# Patient Record
Sex: Female | Born: 1975 | Race: White | Hispanic: No | Marital: Married | Smoking: Current every day smoker
Health system: Southern US, Community
[De-identification: ages and names within clinical notes are randomized; demographics above are authoritative.]

## PROBLEM LIST (undated history)

## (undated) ENCOUNTER — Inpatient Hospital Stay (HOSPITAL_COMMUNITY): Payer: Self-pay

## (undated) DIAGNOSIS — E039 Hypothyroidism, unspecified: Secondary | ICD-10-CM

## (undated) DIAGNOSIS — L0291 Cutaneous abscess, unspecified: Secondary | ICD-10-CM

## (undated) DIAGNOSIS — E079 Disorder of thyroid, unspecified: Secondary | ICD-10-CM

## (undated) DIAGNOSIS — O24419 Gestational diabetes mellitus in pregnancy, unspecified control: Secondary | ICD-10-CM

## (undated) HISTORY — PX: ANKLE SURGERY: SHX546

## (undated) HISTORY — PX: CHOLECYSTECTOMY: SHX55

## (undated) HISTORY — DX: Gestational diabetes mellitus in pregnancy, unspecified control: O24.419

---

## 2003-12-05 ENCOUNTER — Other Ambulatory Visit: Admission: RE | Admit: 2003-12-05 | Discharge: 2003-12-05 | Payer: Self-pay

## 2004-12-31 ENCOUNTER — Other Ambulatory Visit: Admission: RE | Admit: 2004-12-31 | Discharge: 2004-12-31 | Payer: Self-pay

## 2009-10-23 ENCOUNTER — Other Ambulatory Visit: Admission: RE | Admit: 2009-10-23 | Discharge: 2009-10-23 | Payer: Self-pay | Admitting: Unknown Physician Specialty

## 2012-06-23 ENCOUNTER — Emergency Department (HOSPITAL_COMMUNITY)
Admission: EM | Admit: 2012-06-23 | Discharge: 2012-06-23 | Disposition: A | Discharge status: Home / Self Care | Payer: Self-pay | Attending: Emergency Medicine | Admitting: Emergency Medicine

## 2012-06-23 ENCOUNTER — Encounter (HOSPITAL_COMMUNITY): Payer: Self-pay | Admitting: *Deleted

## 2012-06-23 DIAGNOSIS — Z9089 Acquired absence of other organs: Secondary | ICD-10-CM | POA: Insufficient documentation

## 2012-06-23 DIAGNOSIS — L0390 Cellulitis, unspecified: Secondary | ICD-10-CM

## 2012-06-23 DIAGNOSIS — L0231 Cutaneous abscess of buttock: Secondary | ICD-10-CM | POA: Insufficient documentation

## 2012-06-23 DIAGNOSIS — L03317 Cellulitis of buttock: Secondary | ICD-10-CM | POA: Insufficient documentation

## 2012-06-23 LAB — BASIC METABOLIC PANEL
CO2: 25 mEq/L (ref 19–32)
Chloride: 102 mEq/L (ref 96–112)
Creatinine, Ser: 0.67 mg/dL (ref 0.50–1.10)
GFR calc Af Amer: >90 mL/min (ref >90)
Potassium: 3.4 mEq/L — ABNORMAL LOW (ref 3.5–5.1)
Sodium: 136 mEq/L (ref 135–145)

## 2012-06-23 LAB — CBC WITH DIFFERENTIAL/PLATELET
Basophils Absolute: 0 10*3/uL (ref 0.0–0.1)
Basophils Relative: 0 % (ref 0–1)
HCT: 38.2 % (ref 36.0–46.0)
Lymphocytes Relative: 16 % (ref 12–46)
MCHC: 34.8 g/dL (ref 30.0–36.0)
Neutro Abs: 5.8 10*3/uL (ref 1.7–7.7)
Neutrophils Relative %: 69 % (ref 43–77)
Platelets: 146 10*3/uL — ABNORMAL LOW (ref 150–400)
RDW: 12.3 % (ref 11.5–15.5)
WBC: 8.5 10*3/uL (ref 4.0–10.5)

## 2012-06-23 MED ORDER — SODIUM CHLORIDE 0.9 % IV SOLN
Freq: Once | INTRAVENOUS | Status: AC
  Administered 2012-06-23: 1000 mL via INTRAVENOUS

## 2012-06-23 MED ORDER — SULFAMETHOXAZOLE-TRIMETHOPRIM 800-160 MG PO TABS: 1.0000 | ORAL_TABLET | Freq: Two times a day (BID) | ORAL | Status: AC

## 2012-06-23 MED ORDER — VANCOMYCIN HCL IN DEXTROSE 1-5 GM/200ML-% IV SOLN
1000.0000 mg | Freq: Once | INTRAVENOUS | Status: AC
  Administered 2012-06-23: 1000 mg via INTRAVENOUS
  Filled 2012-06-23: qty 200

## 2012-06-23 MED ORDER — TRAMADOL HCL 50 MG PO TABS: 50.0000 mg | ORAL_TABLET | Freq: Four times a day (QID) | ORAL | Status: AC | PRN

## 2012-06-23 NOTE — ED Provider Notes (Signed)
History    This chart was scribed for Benny Lennert, MD, MD by Smitty Pluck. The patient was seen in room APA19 and the patient's care was started at 7:05AM.   CSN: [id number]  Arrival date & time 06/23/12  9528   First MD Initiated Contact with Patient 06/23/12 [phone]      Chief Complaint  Patient presents with  . Abscess    (Consider location/radiation/quality/duration/timing/severity/associated sxs/prior treatment) Patient is a 36 y.o. female presenting with abscess. The history is provided by the patient.  Abscess  This is a new problem. The current episode started less than one week ago. The onset was sudden. The problem occurs continuously. The problem has been gradually worsening. The abscess is present on the left buttock. The problem is moderate. The abscess is characterized by burning. It is unknown what she was exposed to. The abscess first occurred at work.   Vanessa Powell is a 36 y.o. female who presents to the Emergency Department complaining of moderate abscess onset 5 days ago. Pt reports that she felt burning on left buttocks then noticed the area. Symptoms have been constant since onset. Denies radiation. Denies other medical illnesses.   History reviewed. No pertinent past medical history.  Past Surgical History  Procedure Date  . Ankle surgery   . Cholecystectomy     No family history on file.  History  Substance Use Topics  . Smoking status: Current Everyday Smoker -- 0.5 packs/day    Types: Cigarettes  . Smokeless tobacco: Not on file  . Alcohol Use: No    OB History    Grav Para Term Preterm Abortions TAB SAB Ect Mult Living                  Review of Systems  All other systems reviewed and are negative.  10 Systems reviewed and all are negative for acute change except as noted in the HPI.    Allergies  Sulfa antibiotics  Home Medications  No current outpatient prescriptions on file.  BP 115/74  Pulse 101  Temp 98.1 F (36.7 C)  (Oral)  Resp 20  Ht 5\' 6"  (1.676 m)  Wt 182 lb (82.555 kg)  BMI 29.38 kg/m2  SpO2 97%  Physical Exam  Nursing note and vitals reviewed. Constitutional: She is oriented to person, place, and time. She appears well-developed.  HENT:  Head: Normocephalic.  Eyes: Conjunctivae are normal.  Neck: No tracheal deviation present.  Cardiovascular:  No murmur heard. Musculoskeletal: Normal range of motion.  Neurological: She is oriented to person, place, and time.  Skin: Skin is warm.       Left buttocks has erythema 10 cm in diameter  Middle of rash area has 4 cm indurated area No fluctuance    Psychiatric: She has a normal mood and affect.    ED Course  Procedures (including critical care time) DIAGNOSTIC STUDIES: Oxygen Saturation is 97% on room air, normal by my interpretation.    COORDINATION OF CARE: 7:08AM EDP discusses pt ED treatment with pt  7:15AM EDP ordered medication: 0.9% NaCl infusion, vancocin 1000 mg/ 200 ml    Labs Reviewed  CBC WITH DIFFERENTIAL - Abnormal; Notable for the following:    Platelets 146 (*)     Monocytes Relative 14 (*)     Monocytes Absolute 1.2 (*)     All other components within normal limits  BASIC METABOLIC PANEL - Abnormal; Notable for the following:    Potassium 3.4 (*)  Glucose, Bld 101 (*)     All other components within normal limits   No results found.   No diagnosis found.    MDM  cellulitis  Pt instructed to return here Friday or see dr. Leticia Penna.  She was told that she may need to have this infection drained friday     Benny Lennert, MD 06/23/12 [phone]

## 2012-06-23 NOTE — ED Notes (Signed)
Reports a knot on her left upper leg noticed as being sore Friday & has become worse.

## 2012-06-23 NOTE — ED Notes (Signed)
Abscess note to back left leg under buttocks. Redness & warmth noted.

## 2012-06-25 ENCOUNTER — Encounter (HOSPITAL_COMMUNITY): Payer: Self-pay

## 2012-06-25 ENCOUNTER — Emergency Department (HOSPITAL_COMMUNITY)
Admission: EM | Admit: 2012-06-25 | Discharge: 2012-06-25 | Disposition: A | Discharge status: Home / Self Care | Payer: Self-pay | Attending: Emergency Medicine | Admitting: Emergency Medicine

## 2012-06-25 DIAGNOSIS — L03119 Cellulitis of unspecified part of limb: Secondary | ICD-10-CM | POA: Insufficient documentation

## 2012-06-25 DIAGNOSIS — L02419 Cutaneous abscess of limb, unspecified: Secondary | ICD-10-CM | POA: Insufficient documentation

## 2012-06-25 DIAGNOSIS — L0291 Cutaneous abscess, unspecified: Secondary | ICD-10-CM

## 2012-06-25 DIAGNOSIS — F17200 Nicotine dependence, unspecified, uncomplicated: Secondary | ICD-10-CM | POA: Insufficient documentation

## 2012-06-25 HISTORY — DX: Cutaneous abscess, unspecified: L02.91

## 2012-06-25 LAB — CBC WITH DIFFERENTIAL/PLATELET
Basophils Relative: 0 % (ref 0–1)
HCT: 39.5 % (ref 36.0–46.0)
Hemoglobin: 13.6 g/dL (ref 12.0–15.0)
Lymphs Abs: 1.6 10*3/uL (ref 0.7–4.0)
MCH: 32.6 pg (ref 26.0–34.0)
MCHC: 34.4 g/dL (ref 30.0–36.0)
Monocytes Absolute: 1 10*3/uL (ref 0.1–1.0)
Monocytes Relative: 10 % (ref 3–12)
Neutro Abs: 6.7 10*3/uL (ref 1.7–7.7)
RBC: 4.17 MIL/uL (ref 3.87–5.11)

## 2012-06-25 LAB — BASIC METABOLIC PANEL
BUN: 11 mg/dL (ref 6–23)
Chloride: 106 mEq/L (ref 96–112)
Creatinine, Ser: 0.74 mg/dL (ref 0.50–1.10)
GFR calc Af Amer: >90 mL/min (ref >90)
Glucose, Bld: 99 mg/dL (ref 70–99)

## 2012-06-25 MED ORDER — HYDROMORPHONE HCL PF 1 MG/ML IJ SOLN
1.0000 mg | Freq: Once | INTRAMUSCULAR | Status: AC
  Administered 2012-06-25: 1 mg via INTRAVENOUS
  Filled 2012-06-25: qty 1

## 2012-06-25 MED ORDER — HYDROCODONE-ACETAMINOPHEN 5-325 MG PO TABS: 1.0000 | ORAL_TABLET | Freq: Four times a day (QID) | ORAL | Status: AC | PRN

## 2012-06-25 MED ORDER — PROMETHAZINE HCL 25 MG/ML IJ SOLN
12.5000 mg | Freq: Once | INTRAMUSCULAR | Status: AC
  Administered 2012-06-25: 12.5 mg via INTRAVENOUS
  Filled 2012-06-25: qty 1

## 2012-06-25 MED ORDER — VANCOMYCIN HCL IN DEXTROSE 1-5 GM/200ML-% IV SOLN
1000.0000 mg | Freq: Once | INTRAVENOUS | Status: AC
  Administered 2012-06-25: 1000 mg via INTRAVENOUS
  Filled 2012-06-25: qty 200

## 2012-06-25 MED ORDER — ONDANSETRON HCL 4 MG/2ML IJ SOLN
4.0000 mg | Freq: Once | INTRAMUSCULAR | Status: AC
  Administered 2012-06-25: 4 mg via INTRAVENOUS
  Filled 2012-06-25: qty 2

## 2012-06-25 MED ORDER — LIDOCAINE HCL (PF) 1 % IJ SOLN
INTRAMUSCULAR | Status: AC
  Administered 2012-06-25: 09:00:00 via SUBCUTANEOUS
  Filled 2012-06-25: qty 5

## 2012-06-25 MED ORDER — SODIUM CHLORIDE 0.9 % IV BOLUS (SEPSIS)
250.0000 mL | Freq: Once | INTRAVENOUS | Status: AC
  Administered 2012-06-25: 250 mL via INTRAVENOUS

## 2012-06-25 MED ORDER — SODIUM CHLORIDE 0.9 % IV SOLN: INTRAVENOUS | Status: DC

## 2012-06-25 NOTE — ED Notes (Signed)
Pt requested ginger ale which was provided.

## 2012-06-25 NOTE — ED Notes (Signed)
Abscess to back of left leg. States she was here Wednesday for same

## 2012-06-25 NOTE — ED Provider Notes (Signed)
History  This chart was scribed for Vanessa Jakes, MD by Gerlean Ren. This patient was seen in room APA03/APA03 and the patient's care was started at 7:07AM.  CSN: [id number]  Arrival date & time 06/25/12  5621   First MD Initiated Contact with Patient 06/25/12 [phone]      Chief Complaint  Patient presents with  . Abscess    Patient is a 36 y.o. female presenting with abscess. The history is provided by the patient. No language interpreter was used.  Abscess  This is a new problem. The current episode started more than one week ago. The onset was gradual. The problem occurs continuously. The problem has been gradually worsening. The abscess is present on the left upper leg. The abscess is characterized by burning. The abscess first occurred at work. Pertinent negatives include no fever, no diarrhea, no vomiting, no congestion and no sore throat. Recently, medical care has been given at this facility.    Vanessa Powell is a 36 y.o. female who presents to the Emergency Department complaining of a gradual onset, gradually worsening, constant abscess on the posterior left leg near buttock that presented as a burning pain 6 days prior. She lists nausea as an associated symptom and states that the abscess pain is worse with pressure. She reports that burning started when she had just come in from being outside.  Pt reports that the abscess began draining fluid 12 hours PTA.  She was seen 2 days ago in this ED with same complaint at which time she was prescribed SEPTRA.  However, pt has allergies to sulfa medications and was unable to afford doxycycline, the alternative.  She denies fever, sore throat, visual disturbance, CP, cough, emesis, diarrhea, urinary symptoms, back pain, HA and rash as associated symptoms.  She does not have a h/o chronic medical conditions. She is a current everyday smoker but denies alcohol use.  Past Medical History  Diagnosis Date  . Abscess     Past Surgical  History  Procedure Date  . Ankle surgery   . Cholecystectomy     No family history on file.  History  Substance Use Topics  . Smoking status: Current Everyday Smoker -- 0.5 packs/day    Types: Cigarettes  . Smokeless tobacco: Not on file  . Alcohol Use: No    No OB history provided.  Review of Systems  Constitutional: Negative for fever and chills.  HENT: Negative for congestion, sore throat and neck pain.   Eyes: Negative for visual disturbance.  Respiratory: Negative for shortness of breath.   Cardiovascular: Negative for chest pain.  Gastrointestinal: Positive for nausea. Negative for vomiting, abdominal pain and diarrhea.  Genitourinary: Negative for dysuria.  Musculoskeletal: Negative for back pain.  Skin: Positive for wound. Negative for rash.       abscess  Neurological: Negative for headaches.  Hematological: Does not bruise/bleed easily.  Psychiatric/Behavioral: Negative for confusion.    Allergies  Sulfa antibiotics  Home Medications   Current Outpatient Rx  Name Route Sig Dispense Refill  . HYDROCODONE-ACETAMINOPHEN 5-325 MG PO TABS Oral Take 1-2 tablets by mouth every 6 (six) hours as needed for pain. 10 tablet 0  . IBUPROFEN 200 MG PO TABS Oral Take 400 mg by mouth every 6 (six) hours as needed. For pain    . SULFAMETHOXAZOLE-TRIMETHOPRIM 800-160 MG PO TABS Oral Take 1 tablet by mouth every 12 (twelve) hours. 20 tablet 0  . TRAMADOL HCL 50 MG PO TABS Oral Take 1  tablet (50 mg total) by mouth every 6 (six) hours as needed for pain. 30 tablet 0    Triage Vitals: BP 113/76  Pulse 98  Temp 97.7 F (36.5 C) (Oral)  Resp 20  Ht 5\' 6"  (1.676 m)  Wt 182 lb (82.555 kg)  BMI 29.38 kg/m2  SpO2 98%  Physical Exam  Nursing note and vitals reviewed. Constitutional: She is oriented to person, place, and time. She appears well-developed and well-nourished. No distress.  HENT:  Head: Normocephalic and atraumatic.  Mouth/Throat: Oropharynx is clear and moist.    Eyes: Conjunctivae and EOM are normal.  Neck: Neck supple. No tracheal deviation present.  Cardiovascular: Normal rate, regular rhythm and normal heart sounds.   Pulmonary/Chest: Effort normal and breath sounds normal. No respiratory distress.  Abdominal: Soft. Bowel sounds are normal. There is no tenderness.  Musculoskeletal: Normal range of motion. She exhibits no edema.       No groin adenopathy  Lymphadenopathy:    She has no cervical adenopathy.  Neurological: She is alert and oriented to person, place, and time. No cranial nerve deficit.  Skin: Skin is warm and dry.     Psychiatric: She has a normal mood and affect. Her behavior is normal.    ED Course  Procedures (including critical care time)  DIAGNOSTIC STUDIES: Oxygen Saturation is 98% on room air, normal by my interpretation.    COORDINATION OF CARE: 7:30AM-Discussed treatment plan which included incision and drainage of the abscess with pt at bedside and pt agreed to plan.   Labs Reviewed  BASIC METABOLIC PANEL - Abnormal; Notable for the following:    Potassium 3.4 (*)     All other components within normal limits  CBC WITH DIFFERENTIAL   No results found. Results for orders placed during the hospital encounter of 06/25/12  CBC WITH DIFFERENTIAL      Component Value Range   WBC 9.5  4.0 - 10.5 K/uL   RBC 4.17  3.87 - 5.11 MIL/uL   Hemoglobin 13.6  12.0 - 15.0 g/dL   HCT 16.1  09.6 - 04.5 %   MCV 94.7  78.0 - 100.0 fL   MCH 32.6  26.0 - 34.0 pg   MCHC 34.4  30.0 - 36.0 g/dL   RDW 40.9  81.1 - 91.4 %   Platelets 185  150 - 400 K/uL   Neutrophils Relative 71  43 - 77 %   Neutro Abs 6.7  1.7 - 7.7 K/uL   Lymphocytes Relative 17  12 - 46 %   Lymphs Abs 1.6  0.7 - 4.0 K/uL   Monocytes Relative 10  3 - 12 %   Monocytes Absolute 1.0  0.1 - 1.0 K/uL   Eosinophils Relative 2  0 - 5 %   Eosinophils Absolute 0.2  0.0 - 0.7 K/uL   Basophils Relative 0  0 - 1 %   Basophils Absolute 0.0  0.0 - 0.1 K/uL  BASIC  METABOLIC PANEL      Component Value Range   Sodium 141  135 - 145 mEq/L   Potassium 3.4 (*) 3.5 - 5.1 mEq/L   Chloride 106  96 - 112 mEq/L   CO2 23  19 - 32 mEq/L   Glucose, Bld 99  70 - 99 mg/dL   BUN 11  6 - 23 mg/dL   Creatinine, Ser 7.82  0.50 - 1.10 mg/dL   Calcium 9.8  8.4 - 95.6 mg/dL   GFR calc non Af Amer >  90  >90 mL/min   GFR calc Af Amer >90  >90 mL/min     1. Abscess       MDM   Patient with left thigh abscess that was I&Dand packed patient followup in 2 days for recheck patient wasn't able to take antibiotics that she was given before you they were switched to doxycycline for them patient here today given another dose of vancomycin labs without leukocytosis which lites without significant abnormalities. Patient is nontoxic no acute distress.      I personally performed the services described in this documentation, which was scribed in my presence. The recorded information has been reviewed and considered.       INCISION AND DRAINAGE Performed by: Vanessa Powell. Consent: Verbal consent obtained. Risks and benefits: risks, benefits and alternatives were discussed Type: abscess  Body area: left posterior thigh  Anesthesia: local infiltration  Local anesthetic: lidocaine 2% without epinephrine  Anesthetic total: 7 ml  Complexity: complex Blunt dissection to break up loculations  Drainage: purulent  Drainage amount: copious  Packing material: 1/4 in iodoform gauze  Patient tolerance: Patient tolerated the procedure well with no immediate complications.     Vanessa Jakes, MD 06/25/12 [phone]

## 2012-06-27 DIAGNOSIS — L02419 Cutaneous abscess of limb, unspecified: Secondary | ICD-10-CM | POA: Insufficient documentation

## 2012-06-27 DIAGNOSIS — Z4800 Encounter for change or removal of nonsurgical wound dressing: Secondary | ICD-10-CM | POA: Insufficient documentation

## 2012-06-27 DIAGNOSIS — L03119 Cellulitis of unspecified part of limb: Secondary | ICD-10-CM | POA: Insufficient documentation

## 2012-06-28 ENCOUNTER — Emergency Department (HOSPITAL_COMMUNITY)
Admission: EM | Admit: 2012-06-28 | Discharge: 2012-06-28 | Disposition: A | Discharge status: Home / Self Care | Payer: Self-pay | Attending: Emergency Medicine | Admitting: Emergency Medicine

## 2012-06-28 NOTE — ED Notes (Signed)
Refer to downtime documentation forms 

## 2012-06-29 ENCOUNTER — Encounter (HOSPITAL_COMMUNITY): Payer: Self-pay

## 2012-06-29 ENCOUNTER — Emergency Department (HOSPITAL_COMMUNITY)
Admission: EM | Admit: 2012-06-29 | Discharge: 2012-06-29 | Disposition: A | Discharge status: Home / Self Care | Payer: Self-pay | Attending: Emergency Medicine | Admitting: Emergency Medicine

## 2012-06-29 DIAGNOSIS — F17200 Nicotine dependence, unspecified, uncomplicated: Secondary | ICD-10-CM | POA: Insufficient documentation

## 2012-06-29 DIAGNOSIS — Z4800 Encounter for change or removal of nonsurgical wound dressing: Secondary | ICD-10-CM | POA: Insufficient documentation

## 2012-06-29 DIAGNOSIS — Z5189 Encounter for other specified aftercare: Secondary | ICD-10-CM

## 2012-06-29 DIAGNOSIS — Z9089 Acquired absence of other organs: Secondary | ICD-10-CM | POA: Insufficient documentation

## 2012-06-29 NOTE — ED Notes (Signed)
Pt reports had packing put in abscess Sunday, here for packing removal

## 2012-06-29 NOTE — ED Provider Notes (Signed)
Medical screening examination/treatment/procedure(s) were performed by non-physician practitioner and as supervising physician I was immediately available for consultation/collaboration.   Shelda Jakes, MD 06/29/12 [phone]

## 2012-06-29 NOTE — ED Provider Notes (Signed)
History     CSN: [id number]  Arrival date & time 06/29/12  1015   First MD Initiated Contact with Patient 06/29/12 1039      Chief Complaint  Patient presents with  . Wound Check    (Consider location/radiation/quality/duration/timing/severity/associated sxs/prior treatment) HPI Comments: Abscess I&D 4 days ago and wound check with packing exchange 2 days ago.  Told to return again today for re-check.  Denies any pain.  Patient is a 36 y.o. female presenting with wound check. The history is provided by the patient. No language interpreter was used.  Wound Check  She was treated in the ED today. Previous treatment in the ED includes I&D of abscess and oral antibiotics. Treatments since wound repair include a wound recheck, regular soap and water washings and oral antibiotics. Her temperature was unmeasured prior to arrival. There has been colored discharge from the wound. The redness has improved. There is no swelling present. The pain has no pain.    Past Medical History  Diagnosis Date  . Abscess     Past Surgical History  Procedure Date  . Ankle surgery   . Cholecystectomy     No family history on file.  History  Substance Use Topics  . Smoking status: Current Everyday Smoker -- 0.5 packs/day    Types: Cigarettes  . Smokeless tobacco: Not on file  . Alcohol Use: No    OB History    Grav Para Term Preterm Abortions TAB SAB Ect Mult Living                  Review of Systems  Constitutional: Negative for fever and chills.  Skin: Positive for wound.  All other systems reviewed and are negative.    Allergies  Sulfa antibiotics  Home Medications   Current Outpatient Rx  Name Route Sig Dispense Refill  . HYDROCODONE-ACETAMINOPHEN 5-325 MG PO TABS Oral Take 1-2 tablets by mouth every 6 (six) hours as needed for pain. 10 tablet 0  . IBUPROFEN 200 MG PO TABS Oral Take 400 mg by mouth every 6 (six) hours as needed. For pain    . SULFAMETHOXAZOLE-TRIMETHOPRIM  800-160 MG PO TABS Oral Take 1 tablet by mouth every 12 (twelve) hours. 20 tablet 0  . TRAMADOL HCL 50 MG PO TABS Oral Take 1 tablet (50 mg total) by mouth every 6 (six) hours as needed for pain. 30 tablet 0    BP 123/81  Pulse 89  Temp 98.8 F (37.1 C) (Oral)  Resp 20  Ht 5\' 6"  (1.676 m)  Wt 182 lb (82.555 kg)  BMI 29.38 kg/m2  SpO2 100%  Physical Exam  Nursing note and vitals reviewed. Constitutional: She is oriented to person, place, and time. She appears well-developed and well-nourished. No distress.  HENT:  Head: Normocephalic and atraumatic.  Eyes: EOM are normal.  Neck: Normal range of motion.  Cardiovascular: Normal rate, regular rhythm and normal heart sounds.   Pulmonary/Chest: Effort normal and breath sounds normal.  Abdominal: Soft. She exhibits no distension. There is no tenderness.  Musculoskeletal: Normal range of motion.       Legs: Neurological: She is alert and oriented to person, place, and time.  Skin: Skin is warm and dry.  Psychiatric: She has a normal mood and affect. Judgment normal.    ED Course  Procedures (including critical care time)  Labs Reviewed - No data to display No results found.   1. Wound check, abscess       MDM  Continue abx til done.   Warm compresses Remove remaining packing in 2 days.   Return prn.        Evalina Field, [place] 06/29/12 1103

## 2013-05-23 ENCOUNTER — Encounter (HOSPITAL_COMMUNITY): Payer: Self-pay | Admitting: *Deleted

## 2013-05-23 ENCOUNTER — Emergency Department (HOSPITAL_COMMUNITY): Payer: Managed Care, Other (non HMO)

## 2013-05-23 ENCOUNTER — Emergency Department (HOSPITAL_COMMUNITY)
Admission: EM | Admit: 2013-05-23 | Discharge: 2013-05-23 | Disposition: A | Discharge status: Home / Self Care | Payer: Managed Care, Other (non HMO) | Attending: Emergency Medicine | Admitting: Emergency Medicine

## 2013-05-23 DIAGNOSIS — R109 Unspecified abdominal pain: Secondary | ICD-10-CM

## 2013-05-23 DIAGNOSIS — F17200 Nicotine dependence, unspecified, uncomplicated: Secondary | ICD-10-CM | POA: Insufficient documentation

## 2013-05-23 DIAGNOSIS — B9689 Other specified bacterial agents as the cause of diseases classified elsewhere: Secondary | ICD-10-CM

## 2013-05-23 DIAGNOSIS — M549 Dorsalgia, unspecified: Secondary | ICD-10-CM | POA: Insufficient documentation

## 2013-05-23 DIAGNOSIS — Z872 Personal history of diseases of the skin and subcutaneous tissue: Secondary | ICD-10-CM | POA: Insufficient documentation

## 2013-05-23 DIAGNOSIS — N898 Other specified noninflammatory disorders of vagina: Secondary | ICD-10-CM | POA: Insufficient documentation

## 2013-05-23 DIAGNOSIS — N760 Acute vaginitis: Secondary | ICD-10-CM | POA: Insufficient documentation

## 2013-05-23 LAB — URINALYSIS, ROUTINE W REFLEX MICROSCOPIC
Bilirubin Urine: NEGATIVE
Ketones, ur: NEGATIVE mg/dL
Nitrite: NEGATIVE
Urobilinogen, UA: 0.2 mg/dL (ref 0.0–1.0)

## 2013-05-23 LAB — CBC WITH DIFFERENTIAL/PLATELET
Basophils Absolute: 0 10*3/uL (ref 0.0–0.1)
Basophils Relative: 0 % (ref 0–1)
Eosinophils Absolute: 0.1 10*3/uL (ref 0.0–0.7)
Eosinophils Relative: 2 % (ref 0–5)
HCT: 42.3 % (ref 36.0–46.0)
Hemoglobin: 15 g/dL (ref 12.0–15.0)
MCH: 33.6 pg (ref 26.0–34.0)
MCHC: 35.5 g/dL (ref 30.0–36.0)
MCV: 94.6 fL (ref 78.0–100.0)
Monocytes Absolute: 0.6 10*3/uL (ref 0.1–1.0)
Monocytes Relative: 11 % (ref 3–12)
Neutro Abs: 3.6 10*3/uL (ref 1.7–7.7)
RDW: 12.3 % (ref 11.5–15.5)

## 2013-05-23 LAB — WET PREP, GENITAL
Trich, Wet Prep: NONE SEEN
Yeast Wet Prep HPF POC: NONE SEEN

## 2013-05-23 LAB — BASIC METABOLIC PANEL
BUN: 10 mg/dL (ref 6–23)
Calcium: 9.2 mg/dL (ref 8.4–10.5)
Creatinine, Ser: 0.81 mg/dL (ref 0.50–1.10)
GFR calc Af Amer: >90 mL/min (ref >90)

## 2013-05-23 LAB — URINE MICROSCOPIC-ADD ON

## 2013-05-23 LAB — HCG, QUANTITATIVE, PREGNANCY: hCG, Beta Chain, Quant, S: <1 m[IU]/mL (ref <5)

## 2013-05-23 MED ORDER — METRONIDAZOLE 500 MG PO TABS: 500.0000 mg | ORAL_TABLET | Freq: Two times a day (BID) | ORAL | Status: DC

## 2013-05-23 MED ORDER — OXYCODONE-ACETAMINOPHEN 5-325 MG PO TABS: 1.0000 | ORAL_TABLET | Freq: Four times a day (QID) | ORAL | Status: DC | PRN

## 2013-05-23 MED ORDER — PROMETHAZINE HCL 25 MG PO TABS: 25.0000 mg | ORAL_TABLET | Freq: Four times a day (QID) | ORAL | Status: DC | PRN

## 2013-05-23 NOTE — ED Notes (Signed)
Coke and crackers given to pt.

## 2013-05-23 NOTE — ED Provider Notes (Signed)
History     This chart was scribed for Vanessa Hutching, MD, MD by Smitty Pluck, ED Scribe. The patient was seen in room APA19/APA19 and the patient's care was started at 7:47 AM.   CSN: [id number]  Arrival date & time 05/23/13  0700      Chief Complaint  Patient presents with  . Abdominal Pain    The history is provided by the patient and medical records. No language interpreter was used.   HPI Comments: Vanessa Powell is a 37 y.o. female who presents to the Emergency Department complaining of constant, moderate vaginal discharge and vaginal bleeding onset 2 days ago. She reports that she bleed less than a cup. Pt states that she normally has irregular periods. She mentions the vaginal bleeding is consistent with amount of her normal periods now. She mentions having associated lower abdominal cramping and back pain onset 2 days ago. Pt denies fever, chills, nausea, vomiting, diarrhea, weakness, cough, SOB and any other pain. She states she had DEPO injection was July 2013. Pt reports that she is G2 P2 A0. She reports having a cyst on her breast 2 weeks ago.   PCP is Dr. Neita Carp    Past Medical History  Diagnosis Date  . Abscess     Past Surgical History  Procedure Laterality Date  . Ankle surgery    . Cholecystectomy      No family history on file.  History  Substance Use Topics  . Smoking status: Current Every Day Smoker -- 0.50 packs/day    Types: Cigarettes  . Smokeless tobacco: Not on file  . Alcohol Use: No    OB History   Grav Para Term Preterm Abortions TAB SAB Ect Mult Living                  Review of Systems 10 Systems reviewed and all are negative for acute change except as noted in the HPI.   Allergies  Sulfa antibiotics  Home Medications   Current Outpatient Rx  Name  Route  Sig  Dispense  Refill  . ibuprofen (ADVIL,MOTRIN) 200 MG tablet   Oral   Take 400 mg by mouth every 6 (six) hours as needed. For pain           BP 133/84  Pulse 79   Temp(Src) 98.3 F (36.8 C) (Oral)  Resp 16  Ht 5\' 6"  (1.676 m)  Wt 185 lb (83.915 kg)  BMI 29.87 kg/m2  SpO2 98%  LMP 05/21/2013  Physical Exam  Nursing note and vitals reviewed. Constitutional: She is oriented to person, place, and time. She appears well-developed and well-nourished.  HENT:  Head: Normocephalic and atraumatic.  Eyes: Conjunctivae and EOM are normal. Pupils are equal, round, and reactive to light.  Neck: Normal range of motion. Neck supple.  Cardiovascular: Normal rate, regular rhythm and normal heart sounds.   Pulmonary/Chest: Effort normal and breath sounds normal.  Abdominal: Soft. Bowel sounds are normal. There is tenderness (mild lower abdominal ).  Musculoskeletal: Normal range of motion.  Neurological: She is alert and oriented to person, place, and time.  Skin: Skin is warm and dry.  Psychiatric: She has a normal mood and affect.    ED Course  Procedures (including critical care time) DIAGNOSTIC STUDIES: Oxygen Saturation is 98% on room air, normal by my interpretation.    COORDINATION OF CARE: 7:50 AM Discussed ED treatment with pt and pt agrees to pregnancy test, pelvic exam and UA.  Pt informed  that she might be having miscarriage.    Results for orders placed during the hospital encounter of 05/23/13  CBC WITH DIFFERENTIAL      Result Value Range   WBC 5.4  4.0 - 10.5 K/uL   RBC 4.47  3.87 - 5.11 MIL/uL   Hemoglobin 15.0  12.0 - 15.0 g/dL   HCT 16.1  09.6 - 04.5 %   MCV 94.6  78.0 - 100.0 fL   MCH 33.6  26.0 - 34.0 pg   MCHC 35.5  30.0 - 36.0 g/dL   RDW 40.9  81.1 - 91.4 %   Platelets 128 (*) 150 - 400 K/uL   Neutrophils Relative % 68  43 - 77 %   Neutro Abs 3.6  1.7 - 7.7 K/uL   Lymphocytes Relative 19  12 - 46 %   Lymphs Abs 1.0  0.7 - 4.0 K/uL   Monocytes Relative 11  3 - 12 %   Monocytes Absolute 0.6  0.1 - 1.0 K/uL   Eosinophils Relative 2  0 - 5 %   Eosinophils Absolute 0.1  0.0 - 0.7 K/uL   Basophils Relative 0  0 - 1 %    Basophils Absolute 0.0  0.0 - 0.1 K/uL  BASIC METABOLIC PANEL      Result Value Range   Sodium 136  135 - 145 mEq/L   Potassium 4.0  3.5 - 5.1 mEq/L   Chloride 102  96 - 112 mEq/L   CO2 27  19 - 32 mEq/L   Glucose, Bld 101 (*) 70 - 99 mg/dL   BUN 10  6 - 23 mg/dL   Creatinine, Ser 7.82  0.50 - 1.10 mg/dL   Calcium 9.2  8.4 - 95.6 mg/dL   GFR calc non Af Amer >90  >90 mL/min   GFR calc Af Amer >90  >90 mL/min  HCG, QUANTITATIVE, PREGNANCY      Result Value Range   hCG, Beta Chain, Quant, S <1  <5 mIU/mL  URINALYSIS, ROUTINE W REFLEX MICROSCOPIC      Result Value Range   Color, Urine YELLOW  YELLOW   APPearance CLEAR  CLEAR   Specific Gravity, Urine 1.010  1.005 - 1.030   pH 6.0  5.0 - 8.0   Glucose, UA NEGATIVE  NEGATIVE mg/dL   Hgb urine dipstick MODERATE (*) NEGATIVE   Bilirubin Urine NEGATIVE  NEGATIVE   Ketones, ur NEGATIVE  NEGATIVE mg/dL   Protein, ur NEGATIVE  NEGATIVE mg/dL   Urobilinogen, UA 0.2  0.0 - 1.0 mg/dL   Nitrite NEGATIVE  NEGATIVE   Leukocytes, UA NEGATIVE  NEGATIVE  URINE MICROSCOPIC-ADD ON      Result Value Range   Squamous Epithelial / LPF FEW (*) RARE   WBC, UA 0-2  <3 WBC/hpf   RBC / HPF 0-2  <3 RBC/hpf       US Transvaginal Non-ob  05/23/2013   *RADIOLOGY REPORT*  Clinical Data: Abdominal pelvic pain and cramping.  TRANSABDOMINAL AND TRANSVAGINAL ULTRASOUND OF PELVIS Technique:  Both transabdominal and transvaginal ultrasound examinations of the pelvis were performed. Transabdominal technique was performed for global imaging of the pelvis including uterus, ovaries, adnexal regions, and pelvic cul-de-sac.  It was necessary to proceed with endovaginal exam following the transabdominal exam to visualize the endometrium and ovaries to better advantage.  Comparison:  None  Findings:  Uterus: The myometrium appears homogeneous.  The uterus measures 7.4 x 3.5 x 4.8 cm.  Endometrium: 8.1 mm in thickness.  There is trace fluid within the endometrial canal.   Right ovary:  Normal in size and appearance with a small follicle. Measures 4.2 x 1.4 x 1.9 cm.  Left ovary: Normal in size and appearance with a small follicle. Measures 2.8 x 1.4 x 1.9 cm.  Other findings: No free fluid  IMPRESSION: Normal study. No evidence of pelvic mass or other significant abnormality.   Original Report Authenticated By: Carey Bullocks, M.D.   US Pelvis Complete  05/23/2013   *RADIOLOGY REPORT*  Clinical Data: Abdominal pelvic pain and cramping.  TRANSABDOMINAL AND TRANSVAGINAL ULTRASOUND OF PELVIS Technique:  Both transabdominal and transvaginal ultrasound examinations of the pelvis were performed. Transabdominal technique was performed for global imaging of the pelvis including uterus, ovaries, adnexal regions, and pelvic cul-de-sac.  It was necessary to proceed with endovaginal exam following the transabdominal exam to visualize the endometrium and ovaries to better advantage.  Comparison:  None  Findings:  Uterus: The myometrium appears homogeneous.  The uterus measures 7.4 x 3.5 x 4.8 cm.  Endometrium: 8.1 mm in thickness.  There is trace fluid within the endometrial canal.  Right ovary:  Normal in size and appearance with a small follicle. Measures 4.2 x 1.4 x 1.9 cm.  Left ovary: Normal in size and appearance with a small follicle. Measures 2.8 x 1.4 x 1.9 cm.  Other findings: No free fluid  IMPRESSION: Normal study. No evidence of pelvic mass or other significant abnormality.   Original Report Authenticated By: Carey Bullocks, M.D.     No diagnosis found.    MDM  No acute abdomen.  Ultrasound shows no acute findings.  Wet prep reveals evidence of bacterial vaginosis. Discharge meds Flagyl 500 mg twice a day #14, Percocet #20, Phenergan 25 mg #15.  Referral to OB/GYN      I personally performed the services described in this documentation, which was scribed in my presence. The recorded information has been reviewed and is accurate.    Vanessa Hutching, MD 05/23/13  [phone]

## 2013-05-23 NOTE — ED Notes (Signed)
On Saturday, noticed mucus and bleeding from vagina. Pt states irregular periods. Abdominal cramping and back pain since.

## 2013-05-24 LAB — GC/CHLAMYDIA PROBE AMP: GC Probe RNA: NEGATIVE

## 2013-07-28 LAB — OB RESULTS CONSOLE ABO/RH: RH Type: POSITIVE

## 2013-07-28 LAB — OB RESULTS CONSOLE TSH: TSH: 38.05

## 2013-07-28 LAB — OB RESULTS CONSOLE GC/CHLAMYDIA: Gonorrhea: NEGATIVE

## 2013-08-11 ENCOUNTER — Other Ambulatory Visit (HOSPITAL_COMMUNITY): Payer: Self-pay | Admitting: Emergency Medicine

## 2013-08-11 ENCOUNTER — Emergency Department (HOSPITAL_COMMUNITY): Payer: Managed Care, Other (non HMO)

## 2013-08-11 ENCOUNTER — Encounter (HOSPITAL_COMMUNITY): Payer: Self-pay | Admitting: Emergency Medicine

## 2013-08-11 ENCOUNTER — Emergency Department (HOSPITAL_COMMUNITY)
Admission: EM | Admit: 2013-08-11 | Discharge: 2013-08-11 | Disposition: A | Discharge status: Home / Self Care | Payer: Managed Care, Other (non HMO) | Attending: Emergency Medicine | Admitting: Emergency Medicine

## 2013-08-11 DIAGNOSIS — B9689 Other specified bacterial agents as the cause of diseases classified elsewhere: Secondary | ICD-10-CM

## 2013-08-11 DIAGNOSIS — Z8639 Personal history of other endocrine, nutritional and metabolic disease: Secondary | ICD-10-CM | POA: Insufficient documentation

## 2013-08-11 DIAGNOSIS — O2390 Unspecified genitourinary tract infection in pregnancy, unspecified trimester: Secondary | ICD-10-CM | POA: Insufficient documentation

## 2013-08-11 DIAGNOSIS — Z872 Personal history of diseases of the skin and subcutaneous tissue: Secondary | ICD-10-CM | POA: Insufficient documentation

## 2013-08-11 DIAGNOSIS — O30041 Twin pregnancy, dichorionic/diamniotic, first trimester: Secondary | ICD-10-CM

## 2013-08-11 DIAGNOSIS — N939 Abnormal uterine and vaginal bleeding, unspecified: Secondary | ICD-10-CM

## 2013-08-11 DIAGNOSIS — O30009 Twin pregnancy, unspecified number of placenta and unspecified number of amniotic sacs, unspecified trimester: Secondary | ICD-10-CM | POA: Insufficient documentation

## 2013-08-11 DIAGNOSIS — O99330 Smoking (tobacco) complicating pregnancy, unspecified trimester: Secondary | ICD-10-CM | POA: Insufficient documentation

## 2013-08-11 DIAGNOSIS — O200 Threatened abortion: Secondary | ICD-10-CM

## 2013-08-11 DIAGNOSIS — O30049 Twin pregnancy, dichorionic/diamniotic, unspecified trimester: Secondary | ICD-10-CM | POA: Insufficient documentation

## 2013-08-11 DIAGNOSIS — N760 Acute vaginitis: Secondary | ICD-10-CM | POA: Insufficient documentation

## 2013-08-11 DIAGNOSIS — O209 Hemorrhage in early pregnancy, unspecified: Secondary | ICD-10-CM | POA: Insufficient documentation

## 2013-08-11 DIAGNOSIS — Z862 Personal history of diseases of the blood and blood-forming organs and certain disorders involving the immune mechanism: Secondary | ICD-10-CM | POA: Insufficient documentation

## 2013-08-11 HISTORY — DX: Disorder of thyroid, unspecified: E07.9

## 2013-08-11 LAB — WET PREP, GENITAL
Trich, Wet Prep: NONE SEEN
WBC, Wet Prep HPF POC: NONE SEEN
Yeast Wet Prep HPF POC: NONE SEEN

## 2013-08-11 LAB — CBC
HCT: 39.7 % (ref 36.0–46.0)
Hemoglobin: 13.7 g/dL (ref 12.0–15.0)
MCH: 33.1 pg (ref 26.0–34.0)
MCHC: 34.5 g/dL (ref 30.0–36.0)

## 2013-08-11 LAB — URINALYSIS W MICROSCOPIC + REFLEX CULTURE
Bilirubin Urine: NEGATIVE
Glucose, UA: NEGATIVE mg/dL
Ketones, ur: NEGATIVE mg/dL
Leukocytes, UA: NEGATIVE
Protein, ur: NEGATIVE mg/dL
pH: 7 (ref 5.0–8.0)

## 2013-08-11 MED ORDER — METRONIDAZOLE 500 MG PO TABS: 500.0000 mg | ORAL_TABLET | Freq: Two times a day (BID) | ORAL | Status: DC

## 2013-08-11 NOTE — ED Notes (Signed)
Patient reports is [redacted] weeks pregnant and this morning when she wiped after urinating, there was a scant amount of blood on toilet paper. Denies pain or cramping.

## 2013-08-11 NOTE — ED Provider Notes (Signed)
CSN: [id number]     Arrival date & time 08/11/13  0734 History   First MD Initiated Contact with Patient 08/11/13 [id number]     Chief Complaint  Patient presents with  . Vaginal Bleeding    HPI Pt was seen at 0740. Per pt, c/o gradual onset and persistence of constant vaginal bleeding since this morning PTA. Pt states she saw blood on the toilet paper after urinating x2 PTA. Pt hx G3P2, LMP 06/12/13 with EGA [redacted] weeks 4/7 days. Denies pelvic pain, no cramping, no back/flank pain, no dysuria/hematuria, no vaginal discharge, no CP/SOB.    OB/GYN: [facility] in [place] Past Medical History  Diagnosis Date  . Abscess   . Thyroid disease    Past Surgical History  Procedure Laterality Date  . Ankle surgery    . Cholecystectomy     OB History   Grav Para Term Preterm Abortions TAB SAB Ect Mult Living   3 2              History  Substance Use Topics  . Smoking status: Current Every Day Smoker -- 0.50 packs/day    Types: Cigarettes  . Smokeless tobacco: Not on file  . Alcohol Use: No    Review of Systems ROS: Statement: All systems negative except as marked or noted in the HPI; Constitutional: Negative for fever and chills. ; ; Eyes: Negative for eye pain, redness and discharge. ; ; ENMT: Negative for ear pain, hoarseness, nasal congestion, sinus pressure and sore throat. ; ; Cardiovascular: Negative for chest pain, palpitations, diaphoresis, dyspnea and peripheral edema. ; ; Respiratory: Negative for cough, wheezing and stridor. ; ; Gastrointestinal: Negative for nausea, vomiting, diarrhea, abdominal pain, blood in stool, hematemesis, jaundice and rectal bleeding. . ; ; Genitourinary: Negative for dysuria, flank pain and hematuria. ; ; GYN:  +vaginal bleeding, no vaginal discharge, no vulvar pain.;;  Musculoskeletal: Negative for back pain and neck pain. Negative for swelling and trauma.; ; Skin: Negative for pruritus, rash, abrasions, blisters, bruising and skin lesion.; ; Neuro: Negative  for headache, lightheadedness and neck stiffness. Negative for weakness, altered level of consciousness , altered mental status, extremity weakness, paresthesias, involuntary movement, seizure and syncope.       Allergies  Sulfa antibiotics  Home Medications   Current Outpatient Rx  Name  Route  Sig  Dispense  Refill  . ibuprofen (ADVIL,MOTRIN) 200 MG tablet   Oral   Take 400 mg by mouth every 6 (six) hours as needed for pain.          . metroNIDAZOLE (FLAGYL) 500 MG tablet   Oral   Take 1 tablet (500 mg total) by mouth 2 (two) times daily. One po bid x 7 days   14 tablet   0   . oxyCODONE-acetaminophen (PERCOCET/ROXICET) 5-325 MG per tablet   Oral   Take 1-2 tablets by mouth every 6 (six) hours as needed for pain.   20 tablet   0   . promethazine (PHENERGAN) 25 MG tablet   Oral   Take 1 tablet (25 mg total) by mouth every 6 (six) hours as needed.   15 tablet   0    LMP 06/12/2013 Physical Exam 0745: Physical examination:  Nursing notes reviewed; Vital signs and O2 SAT reviewed;  Constitutional: Well developed, Well nourished, Well hydrated, In no acute distress; Head:  Normocephalic, atraumatic; Eyes: EOMI, PERRL, No scleral icterus; ENMT: Mouth and pharynx normal, Mucous membranes moist; Neck: Supple, Full range of  motion, No lymphadenopathy; Cardiovascular: Regular rate and rhythm, No murmur, rub, or gallop; Respiratory: Breath sounds clear & equal bilaterally, No rales, rhonchi, wheezes.  Speaking full sentences with ease, Normal respiratory effort/excursion; Chest: Nontender, Movement normal; Abdomen: Soft, Nontender, Nondistended, Normal bowel sounds; Genitourinary: No CVA tenderness. Pelvic exam performed with permission of pt and female ED tech assist during exam.  External genitalia w/o lesions. Vaginal vault without discharge, scant amount of blood in vault. Cervix w/o lesions, not friable, closed, no active bleeding from os. GC/chlam and wet prep obtained and sent  to lab.  Bimanual exam w/o CMT, uterine or adnexal tenderness.;;; Extremities: Pulses normal, No tenderness, No edema, No calf edema or asymmetry.; Neuro: AA&Ox3, Major CN grossly intact.  Speech clear. Gait steady. No gross focal motor or sensory deficits in extremities.; Skin: Color normal, Warm, Dry.   ED Course  Procedures    MDM  MDM Reviewed: previous chart, nursing note and vitals Reviewed previous: labs Interpretation: labs and ultrasound     Results for orders placed during the hospital encounter of 08/11/13  WET PREP, GENITAL      Result Value Range   Yeast Wet Prep HPF POC NONE SEEN  NONE SEEN   Trich, Wet Prep NONE SEEN  NONE SEEN   Clue Cells Wet Prep HPF POC FEW (*) NONE SEEN   WBC, Wet Prep HPF POC NONE SEEN  NONE SEEN  URINALYSIS W MICROSCOPIC + REFLEX CULTURE      Result Value Range   Color, Urine YELLOW  YELLOW   APPearance CLEAR  CLEAR   Specific Gravity, Urine 1.015  1.005 - 1.030   pH 7.0  5.0 - 8.0   Glucose, UA NEGATIVE  NEGATIVE mg/dL   Hgb urine dipstick LARGE (*) NEGATIVE   Bilirubin Urine NEGATIVE  NEGATIVE   Ketones, ur NEGATIVE  NEGATIVE mg/dL   Protein, ur NEGATIVE  NEGATIVE mg/dL   Urobilinogen, UA 1.0  0.0 - 1.0 mg/dL   Nitrite NEGATIVE  NEGATIVE   Leukocytes, UA NEGATIVE  NEGATIVE   RBC / HPF TOO NUMEROUS TO COUNT  <3 RBC/hpf  CBC      Result Value Range   WBC 9.2  4.0 - 10.5 K/uL   RBC 4.14  3.87 - 5.11 MIL/uL   Hemoglobin 13.7  12.0 - 15.0 g/dL   HCT 30.8  65.7 - 84.6 %   MCV 95.9  78.0 - 100.0 fL   MCH 33.1  26.0 - 34.0 pg   MCHC 34.5  30.0 - 36.0 g/dL   RDW 96.2  95.2 - 84.1 %   Platelets 149 (*) 150 - 400 K/uL  HCG, QUANTITATIVE, PREGNANCY      Result Value Range   hCG, Beta Chain, Quant, S 32440 (*) <5 mIU/mL  ABO/RH      Result Value Range   ABO/RH(D) AB POS     US Ob Comp Less 14 Wks 08/11/2013   CLINICAL DATA:  Vaginal bleeding. Positive pregnancy test. Eight week 4 day gestational age by LMP.  EXAM: US OB LESS THAN 14  WEEK TWIN GESTATION AND TRANSVAGINAL OB ULTRASOUND  COMPARISON:  None.  FINDINGS: TWIN 1  Intrauterine gestational sac: Visualized/normal in shape.  Yolk sac:  VISUALIZED  Embryo:  VISUALIZED  Cardiac Activity: VISUALIZED  Heart Rate: 175 bpm  CRL:  21  mm   8 w 5 d                  [place] EDC: 03/18/2014  TWIN 2  Intrauterine gestational sac: Irregular-shaped and located in lower uterine segment  Yolk sac:  Visualized  Embryo:  Not visualized  MSD: 35  mm   8 w   5  d  Maternal uterus/adnexae: Right ovarian corpus luteum noted. No adnexal mass or free fluid identified.  IMPRESSION:  Dichorionic twin IUP, with 1 living IUP measuring 8 weeks 5 days with U/S EDC of 03/18/2014. The other IUP is likely a failed IUP/ vanishing twin. Continued followup by ultrasound recommended in 10 days.   Electronically Signed   By: Myles Rosenthal   On: 08/11/2013 09:36    2130:  Will tx for BV. Scant blood in vaginal vault with closed cervical os. Will need OB/GYN f/u in 2 days. Pt wants to go home now. Dx and testing d/w pt and family.  Questions answered.  Verb understanding, agreeable to d/c home with outpt f/u.   Laray Anger, DO 08/14/13 1150

## 2013-10-03 LAB — OB RESULTS CONSOLE TSH: TSH: 8.18

## 2013-10-20 ENCOUNTER — Other Ambulatory Visit: Payer: Self-pay | Admitting: Obstetrics & Gynecology

## 2013-10-20 DIAGNOSIS — O3680X0 Pregnancy with inconclusive fetal viability, not applicable or unspecified: Secondary | ICD-10-CM

## 2013-10-21 ENCOUNTER — Encounter: Payer: Self-pay | Admitting: *Deleted

## 2013-10-21 DIAGNOSIS — O09522 Supervision of elderly multigravida, second trimester: Secondary | ICD-10-CM

## 2013-10-21 DIAGNOSIS — E039 Hypothyroidism, unspecified: Secondary | ICD-10-CM

## 2013-10-21 NOTE — Progress Notes (Signed)
Pt had normal pap on 07/28/13

## 2013-10-24 ENCOUNTER — Ambulatory Visit (INDEPENDENT_AMBULATORY_CARE_PROVIDER_SITE_OTHER): Payer: Medicaid Other | Admitting: Women's Health

## 2013-10-24 ENCOUNTER — Ambulatory Visit (INDEPENDENT_AMBULATORY_CARE_PROVIDER_SITE_OTHER): Payer: Managed Care, Other (non HMO)

## 2013-10-24 ENCOUNTER — Other Ambulatory Visit: Payer: Self-pay | Admitting: Obstetrics & Gynecology

## 2013-10-24 ENCOUNTER — Telehealth: Payer: Self-pay | Admitting: Women's Health

## 2013-10-24 ENCOUNTER — Encounter: Payer: Self-pay | Admitting: Women's Health

## 2013-10-24 ENCOUNTER — Encounter (INDEPENDENT_AMBULATORY_CARE_PROVIDER_SITE_OTHER): Payer: Self-pay

## 2013-10-24 VITALS — BP 124/80 | Wt 186.5 lb

## 2013-10-24 DIAGNOSIS — IMO0002 Reserved for concepts with insufficient information to code with codable children: Secondary | ICD-10-CM

## 2013-10-24 DIAGNOSIS — E039 Hypothyroidism, unspecified: Secondary | ICD-10-CM

## 2013-10-24 DIAGNOSIS — Z331 Pregnant state, incidental: Secondary | ICD-10-CM

## 2013-10-24 DIAGNOSIS — O0932 Supervision of pregnancy with insufficient antenatal care, second trimester: Secondary | ICD-10-CM

## 2013-10-24 DIAGNOSIS — O09529 Supervision of elderly multigravida, unspecified trimester: Secondary | ICD-10-CM

## 2013-10-24 DIAGNOSIS — O09522 Supervision of elderly multigravida, second trimester: Secondary | ICD-10-CM

## 2013-10-24 DIAGNOSIS — O3680X0 Pregnancy with inconclusive fetal viability, not applicable or unspecified: Secondary | ICD-10-CM

## 2013-10-24 DIAGNOSIS — O0930 Supervision of pregnancy with insufficient antenatal care, unspecified trimester: Secondary | ICD-10-CM

## 2013-10-24 DIAGNOSIS — O99330 Smoking (tobacco) complicating pregnancy, unspecified trimester: Secondary | ICD-10-CM

## 2013-10-24 DIAGNOSIS — Z1389 Encounter for screening for other disorder: Secondary | ICD-10-CM

## 2013-10-24 DIAGNOSIS — E079 Disorder of thyroid, unspecified: Secondary | ICD-10-CM

## 2013-10-24 DIAGNOSIS — F17200 Nicotine dependence, unspecified, uncomplicated: Secondary | ICD-10-CM | POA: Insufficient documentation

## 2013-10-24 DIAGNOSIS — E669 Obesity, unspecified: Secondary | ICD-10-CM

## 2013-10-24 DIAGNOSIS — O0992 Supervision of high risk pregnancy, unspecified, second trimester: Secondary | ICD-10-CM

## 2013-10-24 LAB — POCT URINALYSIS DIPSTICK: Nitrite, UA: NEGATIVE

## 2013-10-24 NOTE — Telephone Encounter (Signed)
Notified pt per her records from [facility] she is supposed to be taking synthroid , and also that CF not done. Discussed/offered CF testing, pt would like to do. States she will check her synthroid bottle when she gets home to make sure she is taking 151mcg/day.  Cheral Marker, CNM, [facility] 10/24/2013 1:55 PM

## 2013-10-24 NOTE — Patient Instructions (Signed)
Second Trimester of Pregnancy The second trimester is from week 13 through week 28, months 4 through 6. The second trimester is often a time when you feel your best. Your body has also adjusted to being pregnant, and you begin to feel better physically. Usually, morning sickness has lessened or quit completely, you may have more energy, and you may have an increase in appetite. The second trimester is also a time when the fetus is growing rapidly. At the end of the sixth month, the fetus is about 9 inches long and weighs about 1 pounds. You will likely begin to feel the baby move (quickening) between 18 and 20 weeks of the pregnancy. BODY CHANGES Your body goes through many changes during pregnancy. The changes vary from woman to woman.   Your weight will continue to increase. You will notice your lower abdomen bulging out.  You may begin to get stretch marks on your hips, abdomen, and breasts.  You may develop headaches that can be relieved by medicines approved by your caregiver.  You may urinate more often because the fetus is pressing on your bladder.  You may develop or continue to have heartburn as a result of your pregnancy.  You may develop constipation because certain hormones are causing the muscles that push waste through your intestines to slow down.  You may develop hemorrhoids or swollen, bulging veins (varicose veins).  You may have back pain because of the weight gain and pregnancy hormones relaxing your joints between the bones in your pelvis and as a result of a shift in weight and the muscles that support your balance.  Your breasts will continue to grow and be tender.  Your gums may bleed and may be sensitive to brushing and flossing.  Dark spots or blotches (chloasma, mask of pregnancy) may develop on your face. This will likely fade after the baby is born.  A dark line from your belly button to the pubic area (linea nigra) may appear. This will likely fade after the  baby is born. WHAT TO EXPECT AT YOUR PRENATAL VISITS During a routine prenatal visit:  You will be weighed to make sure you and the fetus are growing normally.  Your blood pressure will be taken.  Your abdomen will be measured to track your baby's growth.  The fetal heartbeat will be listened to.  Any test results from the previous visit will be discussed. Your caregiver may ask you:  How you are feeling.  If you are feeling the baby move.  If you have had any abnormal symptoms, such as leaking fluid, bleeding, severe headaches, or abdominal cramping.  If you have any questions. Other tests that may be performed during your second trimester include:  Blood tests that check for:  Low iron levels (anemia).  Gestational diabetes (between 24 and 28 weeks).  Rh antibodies.  Urine tests to check for infections, diabetes, or protein in the urine.  An ultrasound to confirm the proper growth and development of the baby.  An amniocentesis to check for possible genetic problems.  Fetal screens for spina bifida and Down syndrome. HOME CARE INSTRUCTIONS   Avoid all smoking, herbs, alcohol, and unprescribed drugs. These chemicals affect the formation and growth of the baby.  Follow your caregiver's instructions regarding medicine use. There are medicines that are either safe or unsafe to take during pregnancy.  Exercise only as directed by your caregiver. Experiencing uterine cramps is a good sign to stop exercising.  Continue to eat regular,   healthy meals.  Wear a good support bra for breast tenderness.  Do not use hot tubs, steam rooms, or saunas.  Wear your seat belt at all times when driving.  Avoid raw meat, uncooked cheese, cat litter boxes, and soil used by cats. These carry germs that can cause birth defects in the baby.  Take your prenatal vitamins.  Try taking a stool softener (if your caregiver approves) if you develop constipation. Eat more high-fiber foods,  such as fresh vegetables or fruit and whole grains. Drink plenty of fluids to keep your urine clear or pale yellow.  Take warm sitz baths to soothe any pain or discomfort caused by hemorrhoids. Use hemorrhoid cream if your caregiver approves.  If you develop varicose veins, wear support hose. Elevate your feet for 15 minutes, 3 4 times a day. Limit salt in your diet.  Avoid heavy lifting, wear low heel shoes, and practice good posture.  Rest with your legs elevated if you have leg cramps or low back pain.  Visit your dentist if you have not gone yet during your pregnancy. Use a soft toothbrush to brush your teeth and be gentle when you floss.  A sexual relationship may be continued unless your caregiver directs you otherwise.  Continue to go to all your prenatal visits as directed by your caregiver. SEEK MEDICAL CARE IF:   You have dizziness.  You have mild pelvic cramps, pelvic pressure, or nagging pain in the abdominal area.  You have persistent nausea, vomiting, or diarrhea.  You have a bad smelling vaginal discharge.  You have pain with urination. SEEK IMMEDIATE MEDICAL CARE IF:   You have a fever.  You are leaking fluid from your vagina.  You have spotting or bleeding from your vagina.  You have severe abdominal cramping or pain.  You have rapid weight gain or loss.  You have shortness of breath with chest pain.  You notice sudden or extreme swelling of your face, hands, ankles, feet, or legs.  You have not felt your baby move in over an hour.  You have severe headaches that do not go away with medicine.  You have vision changes. Document Released: 11/18/2001 Document Revised: 07/27/2013 Document Reviewed: 01/25/2013 [employer] Patient Information 2014 [employer].  

## 2013-10-24 NOTE — Progress Notes (Signed)
Subjective:    Vanessa Powell is a 37 y.o. G81P2002 Caucasian female at [redacted]w[redacted]d by LMP which is consistent w/ 8wk u/s, being seen today for her first obstetrical visit w/ [place]. She is transferring her care from [facility] in [place], where she began care at 6.4wks. This was a planned pregnancy. Her obstetrical history is significant for advanced maternal age, smoker and 2 term uncomplicated SVDs in 66 and 1997, largest baby weighing 6lb 11oz. .  She had some vb 1st trimester, and was noted to have a vanishing twin on u/s. No further vb since.  She was dx w/ hypothyroidism at 1st ob visit and began on synthroid, w/ dose having to be increased twice since d/t continued abnormal TSH levels. She had a neg AFP, neg pap w/o HRHPV cotesting, rubella equivocal.  She was smoking 1ppd prior to pregnancy, and is down to 4cigs/day.   Patient reports no cramping, vb, uti s/s, or other complaints.Ceasar Mons Vitals:   10/24/13 1044  BP: 124/80  Weight: 186 lb 8 oz (84.596 kg)    HISTORY: OB History  Gravida Para Term Preterm AB SAB TAB Ectopic Multiple Living  3 2        2     # Outcome Date GA Lbr Len/2nd Weight Sex Delivery Anes PTL Lv  3 CUR           2 PAR 1997   6 lb 11 oz (3.033 kg) M SVD   Y  1 PAR 1991   6 lb 7 oz (2.92 kg) M SVD   Y     Past Medical History  Diagnosis Date  . Abscess   . Thyroid disease    Past Surgical History  Procedure Laterality Date  . Ankle surgery    . Cholecystectomy     Family History  Problem Relation Age of Onset  . Cancer Paternal Grandmother   . Diabetes Mother   . Hypertension Mother   . Thyroid disease Maternal Grandmother   . Cancer Maternal Grandfather     lung  . Hypertension Maternal Grandfather   . Cancer Other     lung  . Kidney disease Other      Exam   System:     Skin: normal coloration and turgor, no rashes    Neurologic: oriented, normal mood   Extremities: normal strength, tone, and muscle mass   HEENT PERRLA   Mouth/Teeth mucous membranes moist   Cardiovascular: regular rate and rhythm   Respiratory:  appears well, vitals normal, no respiratory distress, acyanotic, normal RR   Abdomen: soft, non-tender     Normal anatomy u/s today, female Assessment:    Pregnancy: G3P2 Patient Active Problem List   Diagnosis Date Noted  . Smoker 10/24/2013    Priority: High  . Supervision of high-risk pregnancy of elderly multigravida 10/24/2013    Priority: High  . AMA (advanced maternal age) multigravida 35+ 10/24/2013    Priority: High  . Hypothyroid 10/21/2013    Priority: High      [redacted]w[redacted]d G3P2 New OB visit AMA Smoker Hypothyroidism on synthroid Rubella equivocal    Plan:    Continue prenatal vitamins Problem list reviewed and updated Reviewed recommended weight gain based on pre-gravid BMI Encouraged well-balanced diet Genetic Screening discussed Quad Screen: results reviewed Cystic fibrosis screening discussed no results on records from [place], pt desires Ultrasound discussed; fetal survey: results reviewed Follow up in 4 weeks for visit, CF & TSH To continue decreasing smoking  w/ goal of cessation Will need pp mmr Depo vs. BTL Desires to bottlefeed  [name], [name] 10/24/2013 11:31 AM

## 2013-10-24 NOTE — Progress Notes (Signed)
U/S(19+1wks)-active fetus, meas c/w dates, fluid wnl, anterior gr 0 placenta, cx long and closed (3.2cm), bilateral adnexa WNL, no obvious abnl noted, female fetus, Vanishing twin gestational sac identified in posterior uterus,

## 2013-10-25 LAB — DRUG SCREEN, URINE, NO CONFIRMATION
Amphetamine Screen, Ur: NEGATIVE
Barbiturate Quant, Ur: NEGATIVE
Benzodiazepines.: NEGATIVE
Cocaine Metabolites: NEGATIVE
Creatinine,U: 167.3 mg/dL
Marijuana Metabolite: NEGATIVE
Methadone: NEGATIVE
Opiate Screen, Urine: NEGATIVE
Phencyclidine (PCP): NEGATIVE
Propoxyphene: NEGATIVE

## 2013-10-25 LAB — URINE CULTURE

## 2013-11-24 ENCOUNTER — Ambulatory Visit (INDEPENDENT_AMBULATORY_CARE_PROVIDER_SITE_OTHER): Payer: Managed Care, Other (non HMO) | Admitting: Advanced Practice Midwife

## 2013-11-24 ENCOUNTER — Encounter: Payer: Self-pay | Admitting: Advanced Practice Midwife

## 2013-11-24 ENCOUNTER — Encounter (INDEPENDENT_AMBULATORY_CARE_PROVIDER_SITE_OTHER): Payer: Self-pay

## 2013-11-24 VITALS — BP 120/70 | Wt 191.0 lb

## 2013-11-24 DIAGNOSIS — E079 Disorder of thyroid, unspecified: Secondary | ICD-10-CM

## 2013-11-24 DIAGNOSIS — Z3490 Encounter for supervision of normal pregnancy, unspecified, unspecified trimester: Secondary | ICD-10-CM

## 2013-11-24 DIAGNOSIS — O99330 Smoking (tobacco) complicating pregnancy, unspecified trimester: Secondary | ICD-10-CM

## 2013-11-24 DIAGNOSIS — Z1389 Encounter for screening for other disorder: Secondary | ICD-10-CM

## 2013-11-24 DIAGNOSIS — O09529 Supervision of elderly multigravida, unspecified trimester: Secondary | ICD-10-CM

## 2013-11-24 DIAGNOSIS — Z331 Pregnant state, incidental: Secondary | ICD-10-CM

## 2013-11-24 DIAGNOSIS — O0992 Supervision of high risk pregnancy, unspecified, second trimester: Secondary | ICD-10-CM

## 2013-11-24 LAB — TSH: TSH: 10.337 u[IU]/mL — ABNORMAL HIGH (ref 0.350–4.500)

## 2013-11-24 LAB — POCT URINALYSIS DIPSTICK
Glucose, UA: NEGATIVE
Leukocytes, UA: NEGATIVE
Nitrite, UA: NEGATIVE

## 2013-11-24 NOTE — Progress Notes (Signed)
declinced CF testing after discussing that FOB would need to be tested too, and that baby will be tested anyway.  Was told that ecigs were just as bad as regular cigs.  Discussed that nicotine is not great, but if she can cut out the tar, etc of a cigarette that is preferable.  Smoking 4/day.  Will probably convert to ecigs.  TSH today.  Needs refill on meds.  To call tomorrow to get results and see if meds need to be adjusted.  Feels well.  F/U 4 weeks for PN2/LROB

## 2013-11-24 NOTE — Patient Instructions (Signed)
1. Before your test, do not eat or drink anything for 8-10 hours prior to your  appointment (a small amount of water is allowed and you may take any medicines you normally take). 2. When you arrive, your blood will be drawn for a 'fasting' blood sugar level.  Then you will be given a sweetened carbonated beverage to drink. You should  complete drinking this beverage within five minutes. After finishing the  beverage, you will have your blood drawn exactly 1 and 2 hours later. Having  your blood drawn on time is an important part of this test. A total of three blood  samples will be done. 3. The test takes approximately 2  hours. During the test, do not have anything to  eat or drink. Do not smoke, chew gum (not even sugarless gum) or use breath mints.  4. During the test you should remain close by and seated as much as possible and  avoid walking around. You may want to bring a book or something else to  occupy your time.  5. After your test, you may eat and drink as normal. You may want to bring a snack  to eat after the test is finished. Your provider will advise you as to the results of  this test and any follow-up if necessary  

## 2013-11-28 ENCOUNTER — Other Ambulatory Visit: Payer: Self-pay | Admitting: Advanced Practice Midwife

## 2013-11-28 MED ORDER — LEVOTHYROXINE SODIUM 150 MCG PO TABS: 150.0000 ug | ORAL_TABLET | Freq: Every day | ORAL | Status: DC

## 2013-11-28 NOTE — Progress Notes (Signed)
TSH 10.86.  Synthroid increased form to 148mcg/day and pt called and informed.

## 2013-12-12 ENCOUNTER — Encounter: Payer: Self-pay | Admitting: Obstetrics and Gynecology

## 2013-12-12 ENCOUNTER — Encounter (INDEPENDENT_AMBULATORY_CARE_PROVIDER_SITE_OTHER): Payer: Self-pay

## 2013-12-12 ENCOUNTER — Ambulatory Visit (INDEPENDENT_AMBULATORY_CARE_PROVIDER_SITE_OTHER): Payer: Managed Care, Other (non HMO) | Admitting: Obstetrics and Gynecology

## 2013-12-12 ENCOUNTER — Telehealth: Payer: Self-pay | Admitting: Obstetrics and Gynecology

## 2013-12-12 VITALS — BP 120/68 | Wt 191.8 lb

## 2013-12-12 DIAGNOSIS — O99330 Smoking (tobacco) complicating pregnancy, unspecified trimester: Secondary | ICD-10-CM

## 2013-12-12 DIAGNOSIS — O99280 Endocrine, nutritional and metabolic diseases complicating pregnancy, unspecified trimester: Secondary | ICD-10-CM

## 2013-12-12 DIAGNOSIS — O09529 Supervision of elderly multigravida, unspecified trimester: Secondary | ICD-10-CM

## 2013-12-12 DIAGNOSIS — E079 Disorder of thyroid, unspecified: Secondary | ICD-10-CM

## 2013-12-12 DIAGNOSIS — Z331 Pregnant state, incidental: Secondary | ICD-10-CM

## 2013-12-12 DIAGNOSIS — Z1389 Encounter for screening for other disorder: Secondary | ICD-10-CM

## 2013-12-12 LAB — POCT URINALYSIS DIPSTICK
GLUCOSE UA: NEGATIVE
KETONES UA: NEGATIVE
Leukocytes, UA: NEGATIVE
Nitrite, UA: NEGATIVE
Protein, UA: NEGATIVE
RBC UA: NEGATIVE

## 2013-12-12 NOTE — Progress Notes (Signed)
Vanessa BoundsKimberly Powell is a 38 y.o. female who is here for a routine OB check today. She is 26w1. 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   She reports choking on food/liquid 6 days ago and began having coughing and sudden onset, constant LUQ pain. She states lifting the left arm over her head and deep breathing worsens this pain. She denies vomiting.   Fetal heartbeat is 135 bmp. Fundal height is 24.5 cm.   Scheduled for a PN2 in 2 weeks. Tubal options discussed. BTL PAPERS TO BE SIGNED AT 28 WK

## 2013-12-12 NOTE — Telephone Encounter (Signed)
Pt states "thinks she pulled a muscle, Tuesday pt got choked on coffee," sharp pain under left breast when she moves. Taking tylenol with no improvement, heating pad helps some. Pt to be seen today at 2 pm with Dr. Emelda FearFerguson.

## 2013-12-22 ENCOUNTER — Other Ambulatory Visit: Payer: Managed Care, Other (non HMO)

## 2013-12-22 ENCOUNTER — Ambulatory Visit (INDEPENDENT_AMBULATORY_CARE_PROVIDER_SITE_OTHER): Payer: Managed Care, Other (non HMO) | Admitting: Obstetrics & Gynecology

## 2013-12-22 ENCOUNTER — Encounter: Payer: Self-pay | Admitting: Obstetrics & Gynecology

## 2013-12-22 VITALS — BP 130/80 | Wt 193.0 lb

## 2013-12-22 DIAGNOSIS — O99330 Smoking (tobacco) complicating pregnancy, unspecified trimester: Secondary | ICD-10-CM

## 2013-12-22 DIAGNOSIS — Z1389 Encounter for screening for other disorder: Secondary | ICD-10-CM

## 2013-12-22 DIAGNOSIS — E079 Disorder of thyroid, unspecified: Secondary | ICD-10-CM

## 2013-12-22 DIAGNOSIS — O09529 Supervision of elderly multigravida, unspecified trimester: Secondary | ICD-10-CM

## 2013-12-22 DIAGNOSIS — E039 Hypothyroidism, unspecified: Secondary | ICD-10-CM

## 2013-12-22 DIAGNOSIS — O99280 Endocrine, nutritional and metabolic diseases complicating pregnancy, unspecified trimester: Secondary | ICD-10-CM

## 2013-12-22 DIAGNOSIS — O0990 Supervision of high risk pregnancy, unspecified, unspecified trimester: Secondary | ICD-10-CM

## 2013-12-22 DIAGNOSIS — Z331 Pregnant state, incidental: Secondary | ICD-10-CM

## 2013-12-22 LAB — CBC
HCT: 37.9 % (ref 36.0–46.0)
Hemoglobin: 12.9 g/dL (ref 12.0–15.0)
MCH: 31.9 pg (ref 26.0–34.0)
MCHC: 34 g/dL (ref 30.0–36.0)
MCV: 93.8 fL (ref 78.0–100.0)
PLATELETS: 239 10*3/uL (ref 150–400)
RBC: 4.04 MIL/uL (ref 3.87–5.11)
RDW: 13.2 % (ref 11.5–15.5)
WBC: 15.7 10*3/uL — ABNORMAL HIGH (ref 4.0–10.5)

## 2013-12-22 LAB — POCT URINALYSIS DIPSTICK
Blood, UA: NEGATIVE
Glucose, UA: NEGATIVE
KETONES UA: NEGATIVE
LEUKOCYTES UA: NEGATIVE
Nitrite, UA: NEGATIVE
PROTEIN UA: NEGATIVE

## 2013-12-22 MED ORDER — OMEPRAZOLE 20 MG PO CPDR: 20.0000 mg | DELAYED_RELEASE_CAPSULE | Freq: Every day | ORAL | Status: DC

## 2013-12-22 NOTE — Progress Notes (Signed)
BP weight and urine results all reviewed and noted. Patient reports good fetal movement, denies any bleeding and no rupture of membranes symptoms or regular contractions. Patient is without complaints. All questions were answered.  

## 2013-12-22 NOTE — Addendum Note (Signed)
Addended by: Lazaro ArmsEURE, Heaven Meeker H on: 12/22/2013 10:09 AM   Modules accepted: Orders

## 2013-12-23 LAB — RPR

## 2013-12-23 LAB — GLUCOSE TOLERANCE, 2 HOURS W/ 1HR
GLUCOSE, 2 HOUR: 161 mg/dL — AB (ref 70–139)
Glucose, 1 hour: 163 mg/dL (ref 70–170)
Glucose, Fasting: 74 mg/dL (ref 70–99)

## 2013-12-23 LAB — HSV 2 ANTIBODY, IGG: HSV 2 Glycoprotein G Ab, IgG: 7.29 IV — ABNORMAL HIGH

## 2013-12-23 LAB — ANTIBODY SCREEN: Antibody Screen: NEGATIVE

## 2013-12-23 LAB — HIV ANTIBODY (ROUTINE TESTING W REFLEX): HIV: NONREACTIVE

## 2013-12-23 LAB — TSH: TSH: 5.114 u[IU]/mL — ABNORMAL HIGH (ref 0.350–4.500)

## 2014-01-08 ENCOUNTER — Encounter (HOSPITAL_COMMUNITY): Payer: Self-pay

## 2014-01-08 ENCOUNTER — Inpatient Hospital Stay (HOSPITAL_COMMUNITY)
Admission: AD | Admit: 2014-01-08 | Discharge: 2014-01-08 | Disposition: A | Discharge status: Home / Self Care | Payer: Managed Care, Other (non HMO) | Source: Ambulatory Visit | Attending: Family Medicine | Admitting: Family Medicine

## 2014-01-08 DIAGNOSIS — O4700 False labor before 37 completed weeks of gestation, unspecified trimester: Secondary | ICD-10-CM | POA: Insufficient documentation

## 2014-01-08 DIAGNOSIS — O99330 Smoking (tobacco) complicating pregnancy, unspecified trimester: Secondary | ICD-10-CM | POA: Diagnosis not present

## 2014-01-08 DIAGNOSIS — O9989 Other specified diseases and conditions complicating pregnancy, childbirth and the puerperium: Principal | ICD-10-CM

## 2014-01-08 DIAGNOSIS — O99891 Other specified diseases and conditions complicating pregnancy: Secondary | ICD-10-CM | POA: Insufficient documentation

## 2014-01-08 DIAGNOSIS — R197 Diarrhea, unspecified: Secondary | ICD-10-CM | POA: Diagnosis present

## 2014-01-08 LAB — URINALYSIS, ROUTINE W REFLEX MICROSCOPIC
BILIRUBIN URINE: NEGATIVE
Glucose, UA: NEGATIVE mg/dL
Ketones, ur: 15 mg/dL — AB
Leukocytes, UA: NEGATIVE
Nitrite: NEGATIVE
PROTEIN: NEGATIVE mg/dL
UROBILINOGEN UA: 0.2 mg/dL (ref 0.0–1.0)
pH: 6 (ref 5.0–8.0)

## 2014-01-08 LAB — URINE MICROSCOPIC-ADD ON

## 2014-01-08 NOTE — MAU Note (Signed)
Pt presents complaining of diarrhea for two days and feeling weak this morning. Denies vaginal bleeding and discharge. Reports good fetal movement.

## 2014-01-08 NOTE — MAU Provider Note (Signed)
Attestation of Attending Supervision of Advanced Practitioner (PA/CNM/NP): Evaluation and management procedures were performed by the Advanced Practitioner under my supervision and collaboration.  I have reviewed the Advanced Practitioner's note and chart, and I agree with the management and plan.  Frantz Quattrone S, MD [facility] Attending 01/08/2014 3:15 PM   

## 2014-01-08 NOTE — Discharge Instructions (Signed)
Diet for Diarrhea, Adult °Frequent, runny stools (diarrhea) may be caused or worsened by food or drink. Diarrhea may be relieved by changing your diet. Since diarrhea can last up to 7 days, it is easy for you to lose too much fluid from the body and become dehydrated. Fluids that are lost need to be replaced. Along with a modified diet, make sure you drink enough fluids to keep your urine clear or pale yellow. °DIET INSTRUCTIONS °· Ensure adequate fluid intake (hydration): have 1 cup (8 oz) of fluid for each diarrhea episode. Avoid fluids that contain simple sugars or sports drinks, fruit juices, whole milk products, and sodas. Your urine should be clear or pale yellow if you are drinking enough fluids. Hydrate with an oral rehydration solution that you can purchase at pharmacies, retail stores, and online. You can prepare an oral rehydration solution at home by mixing the following ingredients together: °·   tsp table salt. °· ¾ tsp baking soda. °·  tsp salt substitute containing potassium chloride. °· 1  tablespoons sugar. °· 1 L (34 oz) of water. °· Certain foods and beverages may increase the speed at which food moves through the gastrointestinal (GI) tract. These foods and beverages should be avoided and include: °· Caffeinated and alcoholic beverages. °· High-fiber foods, such as raw fruits and vegetables, nuts, seeds, and whole grain breads and cereals. °· Foods and beverages sweetened with sugar alcohols, such as xylitol, sorbitol, and mannitol. °· Some foods may be well tolerated and may help thicken stool including: °· Starchy foods, such as rice, toast, pasta, low-sugar cereal, oatmeal, grits, baked potatoes, crackers, and bagels.   °· Bananas.   °· Applesauce. °· Add probiotic-rich foods to help increase healthy bacteria in the GI tract, such as yogurt and fermented milk products. °RECOMMENDED FOODS AND BEVERAGES °Starches °Choose foods with less than 2 g of fiber per serving. °· Recommended:  White,  French, and pita breads, plain rolls, buns, bagels. Plain muffins, matzo. Soda, saltine, or graham crackers. Pretzels, melba toast, zwieback. Cooked cereals made with water: cornmeal, farina, cream cereals. Dry cereals: refined corn, wheat, rice. Potatoes prepared any way without skins, refined macaroni, spaghetti, noodles, refined rice. °· Avoid:  Bread, rolls, or crackers made with whole wheat, multi-grains, rye, bran seeds, nuts, or coconut. Corn tortillas or taco shells. Cereals containing whole grains, multi-grains, bran, coconut, nuts, raisins. Cooked or dry oatmeal. Coarse wheat cereals, granola. Cereals advertised as "high-fiber." Potato skins. Whole grain pasta, wild or brown rice. Popcorn. Sweet potatoes, yams. Sweet rolls, doughnuts, waffles, pancakes, sweet breads. °Vegetables °· Recommended: Strained tomato and vegetable juices. Most well-cooked and canned vegetables without seeds. Fresh: Tender lettuce, cucumber without the skin, cabbage, spinach, bean sprouts. °· Avoid: Fresh, cooked, or canned: Artichokes, baked beans, beet greens, broccoli, Brussels sprouts, corn, kale, legumes, peas, sweet potatoes. Cooked: Green or red cabbage, spinach. Avoid large servings of any vegetables because vegetables shrink when cooked, and they contain more fiber per serving than fresh vegetables. °Fruit °· Recommended: Cooked or canned: Apricots, applesauce, cantaloupe, cherries, fruit cocktail, grapefruit, grapes, kiwi, mandarin oranges, peaches, pears, plums, watermelon. Fresh: Apples without skin, ripe banana, grapes, cantaloupe, cherries, grapefruit, peaches, oranges, plums. Keep servings limited to ½ cup or 1 piece. °· Avoid: Fresh: Apples with skin, apricots, mangoes, pears, raspberries, strawberries. Prune juice, stewed or dried prunes. Dried fruits, raisins, dates. Large servings of all fresh fruits. °Protein °· Recommended: Ground or well-cooked tender beef, ham, veal, lamb, pork, or poultry. Eggs. Fish,  oysters, shrimp,   lobster, other seafoods. Liver, organ meats. °· Avoid: Tough, fibrous meats with gristle. Peanut butter, smooth or chunky. Cheese, nuts, seeds, legumes, dried peas, beans, lentils. °Dairy °· Recommended: Yogurt, lactose-free milk, kefir, drinkable yogurt, buttermilk, soy milk, or plain hard cheese. °· Avoid: Milk, chocolate milk, beverages made with milk, such as milkshakes. °Soups °· Recommended: Bouillon, broth, or soups made from allowed foods. Any strained soup. °· Avoid: Soups made from vegetables that are not allowed, cream or milk-based soups. °Desserts and Sweets °· Recommended: Sugar-free gelatin, sugar-free frozen ice pops made without sugar alcohol. °· Avoid: Plain cakes and cookies, pie made with fruit, pudding, custard, cream pie. Gelatin, fruit, ice, sherbet, frozen ice pops. Ice cream, ice milk without nuts. Plain hard candy, honey, jelly, molasses, syrup, sugar, chocolate syrup, gumdrops, marshmallows. °Fats and Oils °· Recommended: Limit fats to less than 8 tsp per day. °· Avoid: Seeds, nuts, olives, avocados. Margarine, butter, cream, mayonnaise, salad oils, plain salad dressings. Plain gravy, crisp bacon without rind. °Beverages °· Recommended: Water, decaffeinated teas, oral rehydration solutions, sugar-free beverages not sweetened with sugar alcohols. °· Avoid: Fruit juices, caffeinated beverages (coffee, tea, soda), alcohol, sports drinks, or lemon-lime soda. °Condiments °· Recommended: Ketchup, mustard, horseradish, vinegar, cocoa powder. Spices in moderation: allspice, basil, bay leaves, celery powder or leaves, cinnamon, cumin powder, curry powder, ginger, mace, marjoram, onion or garlic powder, oregano, paprika, parsley flakes, ground pepper, rosemary, sage, savory, tarragon, thyme, turmeric. °· Avoid: Coconut, honey. °Document Released: 02/14/2004 Document Revised: 08/18/2012 Document Reviewed: 04/09/2012 °ExitCare® Patient Information ©2014 [employer]. ° ° °Medicines  During Pregnancy °During pregnancy, there are medicines that are either safe or unsafe to take. Medicines include prescriptions from your caregiver, over-the-counter medicines, topical creams applied to the skin, and all herbal substances. Medicines are put into either Class A, B, C, or D. Class A and B medicines have been shown to be safe in pregnancy. Class C medicines are also considered to be safe in pregnancy, but these medicines should only be used when necessary. Class D medicines should not be used at all in pregnancy. They can be harmful to a baby.  °It is best to take as little medicine as possible while pregnant. However, some medicines are necessary to take for the mother and baby's health. Sometimes, it is more dangerous to stop taking certain medicines than to stay on them. This is often the case for people with long-term (chronic) conditions such as asthma, diabetes, or high blood pressure (hypertension). If you are pregnant and have a chronic illness, call your caregiver right away. Bring a list of your medicines and their doses to your appointments. If you are planning to become pregnant, schedule a doctor's appointment and discuss your medicines with your caregiver. °Lastly, write down the phone number to your pharmacist. They can answer questions regarding a medicine's class and safety. They cannot give advice as to whether you should or should not be on a medicine.  °SAFE AND UNSAFE MEDICINES °There is a long list of medicines that are considered safe in pregnancy. Below is a shorter list. For specific medicines, ask your caregiver.  °Allergy Medicines °Loratadine, cetirizine, and chlorpheniramine are safe to take. Certain nasal steroid sprays are safe. Talk to your caregiver about specific brands that are safe. °Analgesics °Acetaminophen and acetaminophen with codeine are safe to take. All other nonsteroidal anti-inflammatory drugs (NSAIDS) are not safe. This includes ibuprofen.   °Antacids  °Many over-the-counter antacids are safe to take. Talk to your caregiver about specific brands that are   safe. Famotidine, ranitidine, and lansoprazole are safe. Omepresole is considered safe to take in the second trimester. °Antibiotic Medicines  °There are several antibiotics to avoid. These include, but are not limited to, tetracyline, quinolones, and sulfa medications. Talk to your caregiver before taking any antibiotic.  °Antihistamines  °Talk to your caregiver about specific brands that are safe.  °Asthma Medicines  °Most asthma steroid inhalers are safe to take. Talk to your caregiver for specific details. °Calcium  °Calcium supplements are safe to take. Do not take oyster shell calcium.  °Cough and Cold Medicines  °It is safe to take products with guaifenesin or dextromethorphan. Talk to your caregiver about specific brands that are safe. It is not safe to take products that contain aspirin or ibuprofen. °Decongestant Medicines °Pseudoephedrine-containing products are safe to take in the second and third trimester.  °Depression Medicines  °Talk about these medicines with your caregiver.  °Antidiarrheal Medicines  °It is safe to take loperamide. Talk to your caregiver about specific brands that are safe. It is not safe to take any antidiarrheal medicine that contains bismuth. °Eyedrops  °Allergy eyedrops should be limited.  °Iron  °It is safe to use certain iron-containing medicines for anemia in pregnancy. They require a prescription.  °Antinausea Medicines  °It is safe to take doxylamine and vitamin B6 as directed. There are other prescription medicines available, if needed.  °Sleep aids  °It is safe to take diphenhydramine and acetaminophen with diphenhydramine.  °Steroids  °Hydrocortisone creams are safe to use as directed. Oral steroids require a prescription. It is not safe to take any hemorrhoid cream with pramoxine or phenylephrine. °Stool softener  °It is safe to take stool softener  medicines. Avoid daily or prolonged use of stool softeners. °Thyroid Medicine  °It is important to stay on this thyroid medicine. It needs to be followed by your caregiver.  °Vaginal Medicines  °Your caregiver will prescribe a medicine to you if you have a vaginal infection. Certain antifungal medicines are safe to use if you have a sexually transmitted infection (STI). Talk to your caregiver.  °Document Released: 11/24/2005 Document Revised: 02/16/2012 Document Reviewed: 11/25/2011 °ExitCare® Patient Information ©2014 [employer]. ° °

## 2014-01-08 NOTE — MAU Provider Note (Signed)
None     Chief Complaint:  Diarrhea   Treina Arscott is  38 y.o. G3P2002 at [redacted]w[redacted]d presents complaining of Diarrhea .  She states none contractions are associated with none vaginal bleeding, intact membranes, along with active fetal movement. Pt reports loose stools starting Thursday, normal BM Friday, and 6stools since 11pm last night. No blood. 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 Pt has not taken any medications for this condition.  Obstetrical/Gynecological History: OB History   Grav Para Term Preterm Abortions TAB SAB Ect Mult Living   3 2 2       2      Past Medical History: Past Medical History  Diagnosis Date  . Abscess   . Thyroid disease     Past Surgical History: Past Surgical History  Procedure Laterality Date  . Ankle surgery    . Cholecystectomy      Family History: Family History  Problem Relation Age of Onset  . Cancer Paternal Grandmother   . Diabetes Mother   . Hypertension Mother   . Thyroid disease Maternal Grandmother   . Cancer Maternal Grandfather     lung  . Hypertension Maternal Grandfather   . Cancer Other     lung  . Kidney disease Other     Social History: History  Substance Use Topics  . Smoking status: Current Every Day Smoker -- 0.50 packs/day    Types: Cigarettes  . Smokeless tobacco: Never Used  . Alcohol Use: No    Allergies:  Allergies  Allergen Reactions  . Sulfa Antibiotics Anaphylaxis    Meds:  Prescriptions prior to admission  Medication Sig Dispense Refill  . acetaminophen (TYLENOL) 500 MG tablet Take 1,000 mg by mouth every 6 (six) hours as needed for pain.      . Calcium Carbonate Antacid (TUMS PO) Take 1 tablet by mouth daily as needed.       Marland Kitchen levothyroxine (SYNTHROID) 150 MCG tablet Take 1 tablet (150 mcg total) by mouth daily before breakfast.  30 tablet  4  . omeprazole (PRILOSEC) 20 MG capsule Take 1 capsule (20 mg total) by mouth  daily. 1 tablet a day  30 capsule  6  . Prenatal Vit-Fe Fumarate-FA (PRENATAL MULTIVITAMIN) TABS tablet Take 1 tablet by mouth daily at 12 noon.        Review of Systems -   Review of Systems  HPI reviewed above. No abdominal pain.    Physical Exam  Blood pressure 133/85, pulse 98, temperature 97.7 F (36.5 C), temperature source Oral, resp. rate 18, last menstrual period 06/12/2013. GENERAL: Well-developed, well-nourished female in no acute distress.  LUNGS: Clear to auscultation bilaterally.  HEART: Regular rate and rhythm. ABDOMEN: Soft, nontender, nondistended, gravid.  EXTREMITIES: Nontender, no edema, 2+ distal pulses. DTR's 2+  FHT:  Baseline rate 120s bpm   Variability moderate  Accelerations present   Decelerations none Contractions: None  Labs: Results for orders placed during the hospital encounter of 01/08/14 (from the past 24 hour(s))  URINALYSIS, ROUTINE W REFLEX MICROSCOPIC   Collection Time    01/08/14 11:25 AM      Result Value Range   Color, Urine YELLOW  YELLOW   APPearance CLEAR  CLEAR   Specific Gravity, Urine >1.030 (*) 1.005 - 1.030   pH 6.0  5.0 - 8.0   Glucose, UA NEGATIVE  NEGATIVE mg/dL   Hgb urine dipstick TRACE (*) NEGATIVE  Bilirubin Urine NEGATIVE  NEGATIVE   Ketones, ur 15 (*) NEGATIVE mg/dL   Protein, ur NEGATIVE  NEGATIVE mg/dL   Urobilinogen, UA 0.2  0.0 - 1.0 mg/dL   Nitrite NEGATIVE  NEGATIVE   Leukocytes, UA NEGATIVE  NEGATIVE  URINE MICROSCOPIC-ADD ON   Collection Time    01/08/14 11:25 AM      Result Value Range   Squamous Epithelial / LPF FEW (*) RARE   RBC / HPF 0-2  <3 RBC/hpf   Urine-Other MUCOUS PRESENT     Imaging Studies:  No results found.  Assessment: Sherley BoundsKimberly Dillard is  38 y.o. G3P2002 at 6266w0d presents with acute loose stool. Pt given OTC medication list. Return for worsening symptoms or PTL. Pt states understandning and will follow up with [facility]. Unlikely C.diff, or infectious diarrhea without blood and minimal  symptoms. Encouraged hydration. Well hydrated today.  Jolyn LentODOM, Yuto Cajuste RYAN 2/1/201511:58 AM

## 2014-01-10 ENCOUNTER — Other Ambulatory Visit: Payer: Self-pay | Admitting: Obstetrics & Gynecology

## 2014-01-10 ENCOUNTER — Telehealth: Payer: Self-pay | Admitting: Obstetrics & Gynecology

## 2014-01-10 DIAGNOSIS — O99810 Abnormal glucose complicating pregnancy: Secondary | ICD-10-CM

## 2014-01-10 MED ORDER — LEVOTHYROXINE SODIUM 175 MCG PO TABS: 175.0000 ug | ORAL_TABLET | Freq: Every day | ORAL | Status: DC

## 2014-01-10 NOTE — Telephone Encounter (Signed)
Increase synthroid just a bit from 150 to 175

## 2014-01-16 ENCOUNTER — Ambulatory Visit (INDEPENDENT_AMBULATORY_CARE_PROVIDER_SITE_OTHER): Payer: Managed Care, Other (non HMO) | Admitting: Women's Health

## 2014-01-16 ENCOUNTER — Encounter: Payer: Managed Care, Other (non HMO) | Admitting: Women's Health

## 2014-01-16 VITALS — BP 120/76 | Wt 195.0 lb

## 2014-01-16 DIAGNOSIS — O99210 Obesity complicating pregnancy, unspecified trimester: Secondary | ICD-10-CM

## 2014-01-16 DIAGNOSIS — E039 Hypothyroidism, unspecified: Secondary | ICD-10-CM

## 2014-01-16 DIAGNOSIS — R768 Other specified abnormal immunological findings in serum: Secondary | ICD-10-CM

## 2014-01-16 DIAGNOSIS — R7689 Other specified abnormal immunological findings in serum: Secondary | ICD-10-CM | POA: Insufficient documentation

## 2014-01-16 DIAGNOSIS — E669 Obesity, unspecified: Secondary | ICD-10-CM

## 2014-01-16 DIAGNOSIS — O24410 Gestational diabetes mellitus in pregnancy, diet controlled: Secondary | ICD-10-CM

## 2014-01-16 DIAGNOSIS — O24419 Gestational diabetes mellitus in pregnancy, unspecified control: Secondary | ICD-10-CM | POA: Insufficient documentation

## 2014-01-16 DIAGNOSIS — Z1389 Encounter for screening for other disorder: Secondary | ICD-10-CM

## 2014-01-16 DIAGNOSIS — O99330 Smoking (tobacco) complicating pregnancy, unspecified trimester: Secondary | ICD-10-CM

## 2014-01-16 DIAGNOSIS — O09529 Supervision of elderly multigravida, unspecified trimester: Secondary | ICD-10-CM

## 2014-01-16 DIAGNOSIS — Z331 Pregnant state, incidental: Secondary | ICD-10-CM

## 2014-01-16 DIAGNOSIS — E079 Disorder of thyroid, unspecified: Secondary | ICD-10-CM

## 2014-01-16 DIAGNOSIS — O99280 Endocrine, nutritional and metabolic diseases complicating pregnancy, unspecified trimester: Secondary | ICD-10-CM

## 2014-01-16 DIAGNOSIS — O99810 Abnormal glucose complicating pregnancy: Secondary | ICD-10-CM

## 2014-01-16 LAB — POCT URINALYSIS DIPSTICK
Blood, UA: NEGATIVE
Glucose, UA: NEGATIVE
Ketones, UA: NEGATIVE
Leukocytes, UA: NEGATIVE
NITRITE UA: NEGATIVE

## 2014-01-16 MED ORDER — FREESTYLE SYSTEM KIT: 1.0000 | PACK | Status: DC | PRN

## 2014-01-16 NOTE — Patient Instructions (Signed)
Begin checking your blood sugars 4 times a day: in the morning before you eat or drink anything, then 2 hours after breakfast, lunch, and supper. Write all of your results in a notebook/log, and bring with you to every visit.   Gestational Diabetes Mellitus Gestational diabetes mellitus, often simply referred to as gestational diabetes, is a type of diabetes that some women develop during pregnancy. In gestational diabetes, the pancreas does not make enough insulin (a hormone), the cells are less responsive to the insulin that is made (insulin resistance), or both.Normally, insulin moves sugars from food into the tissue cells. The tissue cells use the sugars for energy. The lack of insulin or the lack of normal response to insulin causes excess sugars to build up in the blood instead of going into the tissue cells. As a result, high blood sugar (hyperglycemia) develops. The effect of high sugar (glucose) levels can cause many complications.  RISK FACTORS You have an increased chance of developing gestational diabetes if you have a family history of diabetes and also have one or more of the following risk factors:  A body mass index over 30 (obesity).  A previous pregnancy with gestational diabetes.  An older age at the time of pregnancy. If blood glucose levels are kept in the normal range during pregnancy, women can have a healthy pregnancy. If your blood glucose levels are not well controlled, there may be risks to you, your unborn baby (fetus), your labor and delivery, or your newborn baby.  SYMPTOMS  If symptoms are experienced, they are much like symptoms you would normally expect during pregnancy. The symptoms of gestational diabetes include:   Increased thirst (polydipsia).  Increased urination (polyuria).  Increased urination during the night (nocturia).  Weight loss. This weight loss may be rapid.  Frequent, recurring infections.  Tiredness (fatigue).  Weakness.  Vision  changes, such as blurred vision.  Fruity smell to your breath.  Abdominal pain. DIAGNOSIS Diabetes is diagnosed when blood glucose levels are increased. Your blood glucose level may be checked by one or more of the following blood tests:  A fasting blood glucose test. You will not be allowed to eat for at least 8 hours before a blood sample is taken.  A random blood glucose test. Your blood glucose is checked at any time of the day regardless of when you ate.  A hemoglobin A1c blood glucose test. A hemoglobin A1c test provides information about blood glucose control over the previous 3 months.  An oral glucose tolerance test (OGTT). Your blood glucose is measured after you have not eaten (fasted) for 1 3 hours and then after you drink a glucose-containing beverage. Since the hormones that cause insulin resistance are highest at about 24 28 weeks of a pregnancy, an OGTT is usually performed during that time. If you have risk factors for gestational diabetes, your caregiver may test you for gestational diabetes earlier than 24 weeks of pregnancy. TREATMENT   You will need to take diabetes medicine or insulin daily to keep blood glucose levels in the desired range.  You will need to match insulin dosing with exercise and healthy food choices. The treatment goal is to maintain the before meal (preprandial), bedtime, and overnight blood glucose level at 60 99 mg/dL during pregnancy. The treatment goal is to further maintain peak after meal blood sugar (postprandial glucose) level at 100 140 mg/dL. HOME CARE INSTRUCTIONS   Have your hemoglobin A1c level checked twice a year.  Perform daily blood glucose  monitoring as directed by your caregiver. It is common to perform frequent blood glucose monitoring.  Monitor urine ketones when you are ill and as directed by your caregiver.  Take your diabetes medicine and insulin as directed by your caregiver to maintain your blood glucose level in the  desired range.  Never run out of diabetes medicine or insulin. It is needed every day.  Adjust insulin based on your intake of carbohydrates. Carbohydrates can raise blood glucose levels but need to be included in your diet. Carbohydrates provide vitamins, minerals, and fiber which are an essential part of a healthy diet. Carbohydrates are found in fruits, vegetables, whole grains, dairy products, legumes, and foods containing added sugars.    Eat healthy foods. Alternate 3 meals with 3 snacks.  Maintain a healthy weight gain. The usual total expected weight gain varies according to your prepregnancy body mass index (BMI).  Carry a medical alert card or wear your medical alert jewelry.  Carry a 15 gram carbohydrate snack with you at all times to treat low blood glucose (hypoglycemia). Some examples of 15 gram carbohydrate snacks include:  Glucose tablets, 3 or 4   Glucose gel, 15 gram tube  Raisins, 2 tablespoons (24 g)  Jelly beans, 6  Animal crackers, 8  Fruit juice, regular soda, or low fat milk, 4 ounces (120 mL)  Gummy treats, 9    Recognize hypoglycemia. Hypoglycemia during pregnancy occurs with blood glucose levels of 60 mg/dL and below. The risk for hypoglycemia increases when fasting or skipping meals, during or after intense exercise, and during sleep. Hypoglycemia symptoms can include:  Tremors or shakes.  Decreased ability to concentrate.  Sweating.  Increased heart rate.  Headache.  Dry mouth.  Hunger.  Irritability.  Anxiety.  Restless sleep.  Altered speech or coordination.  Confusion.  Treat hypoglycemia promptly. If you are alert and able to safely swallow, follow the 15:15 rule:  Take 15 20 grams of rapid-acting glucose or carbohydrate. Rapid-acting options include glucose gel, glucose tablets, or 4 ounces (120 mL) of fruit juice, regular soda, or low fat milk.  Check your blood glucose level 15 minutes after taking the  glucose.   Take 15 20 grams more of glucose if the repeat blood glucose level is still 70 mg/dL or below.  Eat a meal or snack within 1 hour once blood glucose levels return to normal.  Be alert to polyuria and polydipsia which are early signs of hyperglycemia. An early awareness of hyperglycemia allows for prompt treatment. Treat hyperglycemia as directed by your caregiver.  Engage in at least 30 minutes of physical activity a day or as directed by your caregiver. Ten minutes of physical activity timed 30 minutes after each meal is encouraged to control postprandial blood glucose levels.  Adjust your insulin dosing and food intake as needed if you start a new exercise or sport.  Follow your sick day plan at any time you are unable to eat or drink as usual.  Avoid tobacco and alcohol use.  Follow up with your caregiver regularly.  Follow the advice of your caregiver regarding your prenatal and post-delivery (postpartum) appointments, meal planning, exercise, medicines, vitamins, blood tests, other medical tests, and physical activities.  Perform daily skin and foot care. Examine your skin and feet daily for cuts, bruises, redness, nail problems, bleeding, blisters, or sores.  Brush your teeth and gums at least twice a day and floss at least once a day. Follow up with your dentist regularly.  Schedule  an eye exam during the first trimester of your pregnancy or as directed by your caregiver.  Share your diabetes management plan with your workplace or school.  Stay up-to-date with immunizations.  Learn to manage stress.  Obtain ongoing diabetes education and support as needed. SEEK MEDICAL CARE IF:   You are unable to eat food or drink fluids for more than 6 hours.  You have nausea and vomiting for more than 6 hours.  You have a blood glucose level of 200 mg/dL and you have ketones in your urine.  There is a change in mental status.  You develop vision problems.  You have a  persistent headache.  You have upper abdominal pain or discomfort.  You develop an additional serious illness.  You have diarrhea for more than 6 hours.  You have been sick or have had a fever for a couple of days and are not getting better. SEEK IMMEDIATE MEDICAL CARE IF:   You have difficulty breathing.  You no longer feel the baby moving.  You are bleeding or have discharge from your vagina.  You start having premature contractions or labor. MAKE SURE YOU:  Understand these instructions.  Will watch your condition.  Will get help right away if you are not doing well or get worse. Document Released: 03/02/2001 Document Revised: 03/21/2013 Document Reviewed: 06/22/2012 [facility] Patient Information 2014 [place], [place].

## 2014-01-16 NOTE — Progress Notes (Signed)
Work-in today, states coworkers made her come in d/t swelling in hands/feet. Denies ha, scotomata, ruq/epigastric pain, n/v.  No pitting edema upper extremities, BLE 2+. Wants to come out of work, states it's just getting to be 'too much'. Notified her it is not medically necessary at this point, and she would be using FMLA time, and employer only obligated to hold job x 12wks- states she is not concerned w/ this that she will always have a job there. 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 Referral to dietician and rx for glucometer/strips/lancets sent. Pt instructed on QID cbg testing and to bring log w/ her to all appts. States her synthroid dose was just changed last week, so will recheck in ~3wks. Reports good fm. Denies uc's, lof, vb, uti s/s. Reviewed ptl s/s, fkc.  All questions answered. F/U in 2d for visit as scheduled and to see how sugars are doing.

## 2014-01-16 NOTE — Progress Notes (Signed)
Pt denies any problems or concerns at this time.  

## 2014-01-18 ENCOUNTER — Ambulatory Visit (INDEPENDENT_AMBULATORY_CARE_PROVIDER_SITE_OTHER): Payer: Managed Care, Other (non HMO) | Admitting: Women's Health

## 2014-01-18 ENCOUNTER — Encounter: Payer: Self-pay | Admitting: Women's Health

## 2014-01-18 ENCOUNTER — Encounter: Payer: Managed Care, Other (non HMO) | Admitting: Women's Health

## 2014-01-18 VITALS — BP 110/82 | Wt 194.0 lb

## 2014-01-18 DIAGNOSIS — O99330 Smoking (tobacco) complicating pregnancy, unspecified trimester: Secondary | ICD-10-CM

## 2014-01-18 DIAGNOSIS — R768 Other specified abnormal immunological findings in serum: Secondary | ICD-10-CM

## 2014-01-18 DIAGNOSIS — Z331 Pregnant state, incidental: Secondary | ICD-10-CM

## 2014-01-18 DIAGNOSIS — Z1389 Encounter for screening for other disorder: Secondary | ICD-10-CM

## 2014-01-18 DIAGNOSIS — E079 Disorder of thyroid, unspecified: Secondary | ICD-10-CM

## 2014-01-18 DIAGNOSIS — O09529 Supervision of elderly multigravida, unspecified trimester: Secondary | ICD-10-CM

## 2014-01-18 DIAGNOSIS — O1210 Gestational proteinuria, unspecified trimester: Secondary | ICD-10-CM

## 2014-01-18 DIAGNOSIS — O99280 Endocrine, nutritional and metabolic diseases complicating pregnancy, unspecified trimester: Secondary | ICD-10-CM

## 2014-01-18 LAB — POCT URINALYSIS DIPSTICK
GLUCOSE UA: NEGATIVE
Leukocytes, UA: NEGATIVE
Nitrite, UA: NEGATIVE

## 2014-01-18 NOTE — Addendum Note (Signed)
Addended by: Cheral MarkerBOOKER, Rees R on: 01/18/2014 01:36 PM   Modules accepted: Orders

## 2014-01-18 NOTE — Progress Notes (Signed)
Reports good fm. Denies uc's, lof, vb, uti s/s.  Has not gotten glucometer/strips/lancets yet- had issue at pharmacy- she is going today to pick them up and start checking cbg's. She has not yet heard from dietician- if she doesn't hear from her by end of week she is to call us. Note given for her to begin maternity leave per her request/not medically indicated. She is not concerned w/ losing her position at [employer].  Reviewed ptl s/s, fkc.  All questions answered. F/U in 1wk for visit and growth/afi/bpp/dopp u/s d/t ama. Bring log to appt.

## 2014-01-18 NOTE — Patient Instructions (Signed)

## 2014-01-19 DIAGNOSIS — Z029 Encounter for administrative examinations, unspecified: Secondary | ICD-10-CM

## 2014-01-20 LAB — URINE CULTURE: Colony Count: 45000

## 2014-01-24 ENCOUNTER — Encounter: Payer: Managed Care, Other (non HMO) | Admitting: Women's Health

## 2014-01-24 ENCOUNTER — Other Ambulatory Visit: Payer: Managed Care, Other (non HMO)

## 2014-01-24 ENCOUNTER — Other Ambulatory Visit: Payer: Managed Care, Other (non HMO) | Admitting: Women's Health

## 2014-01-25 ENCOUNTER — Ambulatory Visit (INDEPENDENT_AMBULATORY_CARE_PROVIDER_SITE_OTHER): Payer: Managed Care, Other (non HMO) | Admitting: Women's Health

## 2014-01-25 ENCOUNTER — Encounter: Payer: Self-pay | Admitting: Women's Health

## 2014-01-25 ENCOUNTER — Other Ambulatory Visit: Payer: Self-pay | Admitting: Women's Health

## 2014-01-25 ENCOUNTER — Ambulatory Visit (HOSPITAL_COMMUNITY)
Admission: RE | Admit: 2014-01-25 | Discharge: 2014-01-25 | Disposition: A | Discharge status: Home / Self Care | Payer: Managed Care, Other (non HMO) | Source: Ambulatory Visit | Attending: Women's Health | Admitting: Women's Health

## 2014-01-25 VITALS — BP 120/80 | Wt 195.0 lb

## 2014-01-25 DIAGNOSIS — O99810 Abnormal glucose complicating pregnancy: Secondary | ICD-10-CM | POA: Diagnosis not present

## 2014-01-25 DIAGNOSIS — O99330 Smoking (tobacco) complicating pregnancy, unspecified trimester: Secondary | ICD-10-CM

## 2014-01-25 DIAGNOSIS — O358XX0 Maternal care for other (suspected) fetal abnormality and damage, not applicable or unspecified: Secondary | ICD-10-CM | POA: Insufficient documentation

## 2014-01-25 DIAGNOSIS — Z331 Pregnant state, incidental: Secondary | ICD-10-CM

## 2014-01-25 DIAGNOSIS — O09529 Supervision of elderly multigravida, unspecified trimester: Secondary | ICD-10-CM

## 2014-01-25 DIAGNOSIS — O24410 Gestational diabetes mellitus in pregnancy, diet controlled: Secondary | ICD-10-CM

## 2014-01-25 DIAGNOSIS — Z1389 Encounter for screening for other disorder: Secondary | ICD-10-CM | POA: Insufficient documentation

## 2014-01-25 DIAGNOSIS — O09523 Supervision of elderly multigravida, third trimester: Secondary | ICD-10-CM

## 2014-01-25 DIAGNOSIS — Z363 Encounter for antenatal screening for malformations: Secondary | ICD-10-CM | POA: Insufficient documentation

## 2014-01-25 DIAGNOSIS — O26849 Uterine size-date discrepancy, unspecified trimester: Secondary | ICD-10-CM

## 2014-01-25 DIAGNOSIS — E079 Disorder of thyroid, unspecified: Secondary | ICD-10-CM

## 2014-01-25 DIAGNOSIS — O98519 Other viral diseases complicating pregnancy, unspecified trimester: Secondary | ICD-10-CM

## 2014-01-25 DIAGNOSIS — O99280 Endocrine, nutritional and metabolic diseases complicating pregnancy, unspecified trimester: Secondary | ICD-10-CM

## 2014-01-25 LAB — POCT URINALYSIS DIPSTICK
GLUCOSE UA: NEGATIVE
LEUKOCYTES UA: NEGATIVE
Nitrite, UA: NEGATIVE
PROTEIN UA: NEGATIVE

## 2014-01-25 NOTE — Patient Instructions (Signed)

## 2014-01-25 NOTE — Progress Notes (Signed)
Reports good fm. Denies uc's, lof, vb, uti s/s.  No complaints.  Did not bring log, reports FBS all <90, 2hr pp 120-130s w/ one 150 but she had been drinking sweet tea all day. Has not heard from dietician yet. FH today 27cm. Was scheduled for growth/afi/bpp/dopp u/s d/t ama- but ultrasonographer not here and do not have any u/s appts here until next tues. With s<d today/ama/a1dm, got nst which was reactive. [facility] at 2:15. Diabetes educator actually called pt and left message during our visit, so pt is to call her back and set up appt to go to class. Decrease carbs over next week, and will re-evaluate 2hr pp sugars. Reviewed ptl s/s, fkc, gave more printed info on diabetic meal planning.  All questions answered. F/U in 1wk for visit.

## 2014-01-30 ENCOUNTER — Encounter: Payer: Self-pay | Admitting: Women's Health

## 2014-02-01 ENCOUNTER — Ambulatory Visit (INDEPENDENT_AMBULATORY_CARE_PROVIDER_SITE_OTHER): Payer: Managed Care, Other (non HMO) | Admitting: Women's Health

## 2014-02-01 ENCOUNTER — Encounter: Payer: Self-pay | Admitting: Women's Health

## 2014-02-01 ENCOUNTER — Encounter: Payer: Managed Care, Other (non HMO) | Attending: Obstetrics & Gynecology

## 2014-02-01 VITALS — Ht 64.0 in | Wt 194.6 lb

## 2014-02-01 VITALS — BP 128/70 | Wt 196.0 lb

## 2014-02-01 DIAGNOSIS — O99810 Abnormal glucose complicating pregnancy: Secondary | ICD-10-CM | POA: Diagnosis present

## 2014-02-01 DIAGNOSIS — E079 Disorder of thyroid, unspecified: Secondary | ICD-10-CM

## 2014-02-01 DIAGNOSIS — O24419 Gestational diabetes mellitus in pregnancy, unspecified control: Secondary | ICD-10-CM

## 2014-02-01 DIAGNOSIS — O09529 Supervision of elderly multigravida, unspecified trimester: Secondary | ICD-10-CM

## 2014-02-01 DIAGNOSIS — O0990 Supervision of high risk pregnancy, unspecified, unspecified trimester: Secondary | ICD-10-CM

## 2014-02-01 DIAGNOSIS — O99280 Endocrine, nutritional and metabolic diseases complicating pregnancy, unspecified trimester: Secondary | ICD-10-CM

## 2014-02-01 DIAGNOSIS — E669 Obesity, unspecified: Secondary | ICD-10-CM

## 2014-02-01 DIAGNOSIS — Z331 Pregnant state, incidental: Secondary | ICD-10-CM

## 2014-02-01 DIAGNOSIS — O09523 Supervision of elderly multigravida, third trimester: Secondary | ICD-10-CM

## 2014-02-01 DIAGNOSIS — Z1389 Encounter for screening for other disorder: Secondary | ICD-10-CM

## 2014-02-01 DIAGNOSIS — O99210 Obesity complicating pregnancy, unspecified trimester: Secondary | ICD-10-CM

## 2014-02-01 DIAGNOSIS — O99330 Smoking (tobacco) complicating pregnancy, unspecified trimester: Secondary | ICD-10-CM

## 2014-02-01 DIAGNOSIS — L299 Pruritus, unspecified: Secondary | ICD-10-CM

## 2014-02-01 LAB — POCT URINALYSIS DIPSTICK
Glucose, UA: NEGATIVE
Ketones, UA: NEGATIVE
Leukocytes, UA: NEGATIVE
NITRITE UA: NEGATIVE
Protein, UA: NEGATIVE

## 2014-02-01 MED ORDER — METFORMIN HCL 500 MG PO TABS: 500.0000 mg | ORAL_TABLET | Freq: Every day | ORAL | Status: DC

## 2014-02-01 MED ORDER — ACYCLOVIR 400 MG PO TABS: 400.0000 mg | ORAL_TABLET | Freq: Three times a day (TID) | ORAL | Status: DC

## 2014-02-01 MED ORDER — OMEPRAZOLE 20 MG PO CPDR: 20.0000 mg | DELAYED_RELEASE_CAPSULE | Freq: Every day | ORAL | Status: DC

## 2014-02-01 NOTE — Progress Notes (Signed)
Was supposed to come yesterday for NST, but office closed d/t snow. Reports good fm. Denies uc's, lof, vb, uti s/s. Unable to sleep at night d/t generalized itching also being on palms and soles, and restless legs. Not fasting today, but supposed to snow tonight so will not be able to come back in am for fasting bile acids, so will get today. Meeting w/ diabetes educator today. States she has been trying to decrease carbs, but reports still eating biscuits, etc. Discussed importance of strict glycemic control and adherence to low carb diet during pregnancy as well as potential complications from uncontrolled diabetes during pregnancy.  FBS 80s, all 2hr pp abnormal 120s-130s. Will start metformin 500mg  q am (didn't do glyburide b/c of anaphylactic rxn to sulfa). NST reactive.  Will begin bpp u/s Mon and NST Thurs. Reviewed ptl s/s, fkc.  All questions answered. F/U Mon for bpp u/s and visit.

## 2014-02-01 NOTE — Patient Instructions (Signed)
Metformin tablets What is this medicine? METFORMIN (met FOR min) is used to treat type 2 diabetes. It helps to control blood sugar. Treatment is combined with diet and exercise. This medicine can be used alone or with other medicines for diabetes. This medicine may be used for other purposes; ask your health care provider or pharmacist if you have questions. COMMON BRAND NAME(S): Glucophage What should I tell my health care provider before I take this medicine? They need to know if you have any of these conditions: -anemia -frequently drink alcohol-containing beverages -become easily dehydrated -heart attack -heart failure that is treated with medications -kidney disease -liver disease -polycystic ovary syndrome -serious infection or injury -vomiting -an unusual or allergic reaction to metformin, other medicines, foods, dyes, or preservatives -pregnant or trying to get pregnant -breast-feeding How should I use this medicine? Take this medicine by mouth. Take it with meals. Swallow the tablets with a drink of water. Follow the directions on the prescription label. Take your medicine at regular intervals. Do not take your medicine more often than directed. Talk to your pediatrician regarding the use of this medicine in children. While this drug may be prescribed for children as young as 10 years of age for selected conditions, precautions do apply. Overdosage: If you think you have taken too much of this medicine contact a poison control center or emergency room at once. NOTE: This medicine is only for you. Do not share this medicine with others. What if I miss a dose? If you miss a dose, take it as soon as you can. If it is almost time for your next dose, take only that dose. Do not take double or extra doses. What may interact with this medicine? Do not take this medicine with any of the following medications: -dofetilide -gatifloxacin -certain contrast medicines given before X-rays,  CT scans, MRI, or other procedures This medicine may also interact with the following medications: -digoxin -diuretics -female hormones, like estrogens or progestins and birth control pills -isoniazid -medicines for blood pressure, heart disease, irregular heart beat -morphine -nicotinic acid -phenothiazines like chlorpromazine, mesoridazine, prochlorperazine, thioridazine -phenytoin -procainamide -quinidine -quinine -ranitidine -steroid medicines like prednisone or cortisone -stimulant medicines for attention disorders, weight loss, or to stay awake -thyroid medicines -trimethoprim -vancomycin This list may not describe all possible interactions. Give your health care provider a list of all the medicines, herbs, non-prescription drugs, or dietary supplements you use. Also tell them if you smoke, drink alcohol, or use illegal drugs. Some items may interact with your medicine. What should I watch for while using this medicine? Visit your doctor or health care professional for regular checks on your progress. A test called the HbA1C (A1C) will be monitored. This is a simple blood test. It measures your blood sugar control over the last 2 to 3 months. You will receive this test every 3 to 6 months. Learn how to check your blood sugar. Learn the symptoms of low and high blood sugar and how to manage them. Always carry a quick-source of sugar with you in case you have symptoms of low blood sugar. Examples include hard sugar candy or glucose tablets. Make sure others know that you can choke if you eat or drink when you develop serious symptoms of low blood sugar, such as seizures or unconsciousness. They must get medical help at once. Tell your doctor or health care professional if you have high blood sugar. You might need to change the dose of your medicine. If you are   sick or exercising more than usual, you might need to change the dose of your medicine. Do not skip meals. Ask your doctor or  health care professional if you should avoid alcohol. Many nonprescription cough and cold products contain sugar or alcohol. These can affect blood sugar. This medicine may cause ovulation in premenopausal women who do not have regular monthly periods. This may increase your chances of becoming pregnant. You should not take this medicine if you become pregnant or think you may be pregnant. Talk with your doctor or health care professional about your birth control options while taking this medicine. Contact your doctor or health care professional right away if think you are pregnant. If you are going to need surgery, a MRI, CT scan, or other procedure, tell your doctor that you are taking this medicine. You may need to stop taking this medicine before the procedure. Wear a medical ID bracelet or chain, and carry a card that describes your disease and details of your medicine and dosage times. What side effects may I notice from receiving this medicine? Side effects that you should report to your doctor or health care professional as soon as possible: -allergic reactions like skin rash, itching or hives, swelling of the face, lips, or tongue -breathing problems -feeling faint or lightheaded, falls -muscle aches or pains -signs and symptoms of low blood sugar such as feeling anxious, confusion, dizziness, increased hunger, unusually weak or tired, sweating, shakiness, cold, irritable, headache, blurred vision, fast heartbeat, loss of consciousness -slow or irregular heartbeat -unusual stomach pain or discomfort -unusually tired or weak Side effects that usually do not require medical attention (report to your doctor or health care professional if they continue or are bothersome): -diarrhea -headache -heartburn -metallic taste in mouth -nausea -stomach gas, upset This list may not describe all possible side effects. Call your doctor for medical advice about side effects. You may report side effects  to FDA at 1-800-FDA-1088. Where should I keep my medicine? Keep out of the reach of children. Store at room temperature between 15 and 30 degrees C (59 and 86 degrees F). Protect from moisture and light. Throw away any unused medicine after the expiration date. NOTE: This sheet is a summary. It may not cover all possible information. If you have questions about this medicine, talk to your doctor, pharmacist, or health care provider.  2014, Elsevier/Gold Standard. (2013-03-08 16:03:44)  Gestational Diabetes Mellitus Gestational diabetes mellitus, often simply referred to as gestational diabetes, is a type of diabetes that some women develop during pregnancy. In gestational diabetes, the pancreas does not make enough insulin (a hormone), the cells are less responsive to the insulin that is made (insulin resistance), or both.Normally, insulin moves sugars from food into the tissue cells. The tissue cells use the sugars for energy. The lack of insulin or the lack of normal response to insulin causes excess sugars to build up in the blood instead of going into the tissue cells. As a result, high blood sugar (hyperglycemia) develops. The effect of high sugar (glucose) levels can cause many complications.  RISK FACTORS You have an increased chance of developing gestational diabetes if you have a family history of diabetes and also have one or more of the following risk factors:  A body mass index over 30 (obesity).  A previous pregnancy with gestational diabetes.  An older age at the time of pregnancy. If blood glucose levels are kept in the normal range during pregnancy, women can have a healthy pregnancy.  If your blood glucose levels are not well controlled, there may be risks to you, your unborn baby (fetus), your labor and delivery, or your newborn baby.  SYMPTOMS  If symptoms are experienced, they are much like symptoms you would normally expect during pregnancy. The symptoms of gestational  diabetes include:   Increased thirst (polydipsia).  Increased urination (polyuria).  Increased urination during the night (nocturia).  Weight loss. This weight loss may be rapid.  Frequent, recurring infections.  Tiredness (fatigue).  Weakness.  Vision changes, such as blurred vision.  Fruity smell to your breath.  Abdominal pain. DIAGNOSIS Diabetes is diagnosed when blood glucose levels are increased. Your blood glucose level may be checked by one or more of the following blood tests:  A fasting blood glucose test. You will not be allowed to eat for at least 8 hours before a blood sample is taken.  A random blood glucose test. Your blood glucose is checked at any time of the day regardless of when you ate.  A hemoglobin A1c blood glucose test. A hemoglobin A1c test provides information about blood glucose control over the previous 3 months.  An oral glucose tolerance test (OGTT). Your blood glucose is measured after you have not eaten (fasted) for 1 3 hours and then after you drink a glucose-containing beverage. Since the hormones that cause insulin resistance are highest at about 24 28 weeks of a pregnancy, an OGTT is usually performed during that time. If you have risk factors for gestational diabetes, your caregiver may test you for gestational diabetes earlier than 24 weeks of pregnancy. TREATMENT   You will need to take diabetes medicine or insulin daily to keep blood glucose levels in the desired range.  You will need to match insulin dosing with exercise and healthy food choices. The treatment goal is to maintain the before meal (preprandial), bedtime, and overnight blood glucose level at 60 99 mg/dL during pregnancy. The treatment goal is to further maintain peak after meal blood sugar (postprandial glucose) level at 100 140 mg/dL. HOME CARE INSTRUCTIONS   Have your hemoglobin A1c level checked twice a year.  Perform daily blood glucose monitoring as directed by your  caregiver. It is common to perform frequent blood glucose monitoring.  Monitor urine ketones when you are ill and as directed by your caregiver.  Take your diabetes medicine and insulin as directed by your caregiver to maintain your blood glucose level in the desired range.  Never run out of diabetes medicine or insulin. It is needed every day.  Adjust insulin based on your intake of carbohydrates. Carbohydrates can raise blood glucose levels but need to be included in your diet. Carbohydrates provide vitamins, minerals, and fiber which are an essential part of a healthy diet. Carbohydrates are found in fruits, vegetables, whole grains, dairy products, legumes, and foods containing added sugars.    Eat healthy foods. Alternate 3 meals with 3 snacks.  Maintain a healthy weight gain. The usual total expected weight gain varies according to your prepregnancy body mass index (BMI).  Carry a medical alert card or wear your medical alert jewelry.  Carry a 15 gram carbohydrate snack with you at all times to treat low blood glucose (hypoglycemia). Some examples of 15 gram carbohydrate snacks include:  Glucose tablets, 3 or 4   Glucose gel, 15 gram tube  Raisins, 2 tablespoons (24 g)  Jelly beans, 6  Animal crackers, 8  Fruit juice, regular soda, or low fat milk, 4 ounces (120 mL)  Gummy treats, 9    Recognize hypoglycemia. Hypoglycemia during pregnancy occurs with blood glucose levels of 60 mg/dL and below. The risk for hypoglycemia increases when fasting or skipping meals, during or after intense exercise, and during sleep. Hypoglycemia symptoms can include:  Tremors or shakes.  Decreased ability to concentrate.  Sweating.  Increased heart rate.  Headache.  Dry mouth.  Hunger.  Irritability.  Anxiety.  Restless sleep.  Altered speech or coordination.  Confusion.  Treat hypoglycemia promptly. If you are alert and able to safely swallow, follow the 15:15  rule:  Take 15 20 grams of rapid-acting glucose or carbohydrate. Rapid-acting options include glucose gel, glucose tablets, or 4 ounces (120 mL) of fruit juice, regular soda, or low fat milk.  Check your blood glucose level 15 minutes after taking the glucose.   Take 15 20 grams more of glucose if the repeat blood glucose level is still 70 mg/dL or below.  Eat a meal or snack within 1 hour once blood glucose levels return to normal.  Be alert to polyuria and polydipsia which are early signs of hyperglycemia. An early awareness of hyperglycemia allows for prompt treatment. Treat hyperglycemia as directed by your caregiver.  Engage in at least 30 minutes of physical activity a day or as directed by your caregiver. Ten minutes of physical activity timed 30 minutes after each meal is encouraged to control postprandial blood glucose levels.  Adjust your insulin dosing and food intake as needed if you start a new exercise or sport.  Follow your sick day plan at any time you are unable to eat or drink as usual.  Avoid tobacco and alcohol use.  Follow up with your caregiver regularly.  Follow the advice of your caregiver regarding your prenatal and post-delivery (postpartum) appointments, meal planning, exercise, medicines, vitamins, blood tests, other medical tests, and physical activities.  Perform daily skin and foot care. Examine your skin and feet daily for cuts, bruises, redness, nail problems, bleeding, blisters, or sores.  Brush your teeth and gums at least twice a day and floss at least once a day. Follow up with your dentist regularly.  Schedule an eye exam during the first trimester of your pregnancy or as directed by your caregiver.  Share your diabetes management plan with your workplace or school.  Stay up-to-date with immunizations.  Learn to manage stress.  Obtain ongoing diabetes education and support as needed. SEEK MEDICAL CARE IF:   You are unable to eat food or  drink fluids for more than 6 hours.  You have nausea and vomiting for more than 6 hours.  You have a blood glucose level of 200 mg/dL and you have ketones in your urine.  There is a change in mental status.  You develop vision problems.  You have a persistent headache.  You have upper abdominal pain or discomfort.  You develop an additional serious illness.  You have diarrhea for more than 6 hours.  You have been sick or have had a fever for a couple of days and are not getting better. SEEK IMMEDIATE MEDICAL CARE IF:   You have difficulty breathing.  You no longer feel the baby moving.  You are bleeding or have discharge from your vagina.  You start having premature contractions or labor. MAKE SURE YOU:  Understand these instructions.  Will watch your condition.  Will get help right away if you are not doing well or get worse. Document Released: 03/02/2001 Document Revised: 03/21/2013 Document Reviewed: 06/22/2012 ExitCare  Patient Information 2014 [place].

## 2014-02-03 LAB — BILE ACIDS, TOTAL: Bile Acids Total: 11 umol/L (ref 0–19)

## 2014-02-04 ENCOUNTER — Encounter: Payer: Self-pay | Admitting: Women's Health

## 2014-02-04 DIAGNOSIS — K831 Obstruction of bile duct: Secondary | ICD-10-CM | POA: Insufficient documentation

## 2014-02-04 DIAGNOSIS — O26613 Liver and biliary tract disorders in pregnancy, third trimester: Secondary | ICD-10-CM

## 2014-02-06 ENCOUNTER — Encounter: Payer: Self-pay | Admitting: Obstetrics & Gynecology

## 2014-02-06 ENCOUNTER — Encounter: Payer: Self-pay | Admitting: Women's Health

## 2014-02-06 ENCOUNTER — Other Ambulatory Visit: Payer: Self-pay | Admitting: Obstetrics & Gynecology

## 2014-02-06 ENCOUNTER — Ambulatory Visit (INDEPENDENT_AMBULATORY_CARE_PROVIDER_SITE_OTHER): Payer: Managed Care, Other (non HMO)

## 2014-02-06 ENCOUNTER — Ambulatory Visit (INDEPENDENT_AMBULATORY_CARE_PROVIDER_SITE_OTHER): Payer: Managed Care, Other (non HMO) | Admitting: Obstetrics & Gynecology

## 2014-02-06 VITALS — BP 120/80 | Wt 196.0 lb

## 2014-02-06 DIAGNOSIS — O09529 Supervision of elderly multigravida, unspecified trimester: Secondary | ICD-10-CM

## 2014-02-06 DIAGNOSIS — O26849 Uterine size-date discrepancy, unspecified trimester: Secondary | ICD-10-CM

## 2014-02-06 DIAGNOSIS — O99210 Obesity complicating pregnancy, unspecified trimester: Secondary | ICD-10-CM

## 2014-02-06 DIAGNOSIS — E079 Disorder of thyroid, unspecified: Secondary | ICD-10-CM

## 2014-02-06 DIAGNOSIS — O99810 Abnormal glucose complicating pregnancy: Secondary | ICD-10-CM

## 2014-02-06 DIAGNOSIS — E669 Obesity, unspecified: Secondary | ICD-10-CM

## 2014-02-06 DIAGNOSIS — K831 Obstruction of bile duct: Secondary | ICD-10-CM

## 2014-02-06 DIAGNOSIS — O99280 Endocrine, nutritional and metabolic diseases complicating pregnancy, unspecified trimester: Secondary | ICD-10-CM

## 2014-02-06 DIAGNOSIS — O99330 Smoking (tobacco) complicating pregnancy, unspecified trimester: Secondary | ICD-10-CM

## 2014-02-06 DIAGNOSIS — Z331 Pregnant state, incidental: Secondary | ICD-10-CM

## 2014-02-06 DIAGNOSIS — Z1389 Encounter for screening for other disorder: Secondary | ICD-10-CM

## 2014-02-06 DIAGNOSIS — O26619 Liver and biliary tract disorders in pregnancy, unspecified trimester: Principal | ICD-10-CM

## 2014-02-06 DIAGNOSIS — O98519 Other viral diseases complicating pregnancy, unspecified trimester: Secondary | ICD-10-CM

## 2014-02-06 LAB — POCT URINALYSIS DIPSTICK
Blood, UA: NEGATIVE
Glucose, UA: NEGATIVE
Ketones, UA: NEGATIVE
Leukocytes, UA: NEGATIVE
Nitrite, UA: NEGATIVE
Protein, UA: NEGATIVE

## 2014-02-06 NOTE — Progress Notes (Signed)
Sonogram done and report done, all normal  Only itches when goes to bed, recommend some benadryl No fasting Bile acids 11, very borderline, repeat bile acids + CMP on Thursday am, fasting.  Still unsure of ICP diagnosis, will assume so for now.  BP weight and urine results all reviewed and noted. Patient reports good fetal movement, denies any bleeding and no rupture of membranes symptoms or regular contractions. Patient is without complaints. All questions were answered.

## 2014-02-06 NOTE — Progress Notes (Signed)
U/S(34+1wks)-vtx active fetus BPP 8/8, fluid WNL AFI-6.4cm, Single Deepest Pocket=3.4cm, anterior Gr 2 placenta, UA Doppler RI-0.66 & 0.70, FHR-139bpm, EFW 4 lb 8 oz(24th%tile)

## 2014-02-06 NOTE — Addendum Note (Signed)
Addended by: Criss AlvinePULLIAM, CHRYSTAL G on: 02/06/2014 04:53 PM   Modules accepted: Orders

## 2014-02-09 ENCOUNTER — Ambulatory Visit (INDEPENDENT_AMBULATORY_CARE_PROVIDER_SITE_OTHER): Payer: Managed Care, Other (non HMO) | Admitting: Obstetrics & Gynecology

## 2014-02-09 ENCOUNTER — Encounter: Payer: Self-pay | Admitting: Obstetrics & Gynecology

## 2014-02-09 ENCOUNTER — Other Ambulatory Visit: Payer: Managed Care, Other (non HMO) | Admitting: Obstetrics & Gynecology

## 2014-02-09 VITALS — BP 120/82 | Wt 196.0 lb

## 2014-02-09 DIAGNOSIS — Z1389 Encounter for screening for other disorder: Secondary | ICD-10-CM

## 2014-02-09 DIAGNOSIS — O99280 Endocrine, nutritional and metabolic diseases complicating pregnancy, unspecified trimester: Secondary | ICD-10-CM

## 2014-02-09 DIAGNOSIS — L299 Pruritus, unspecified: Secondary | ICD-10-CM | POA: Insufficient documentation

## 2014-02-09 DIAGNOSIS — E669 Obesity, unspecified: Secondary | ICD-10-CM

## 2014-02-09 DIAGNOSIS — Z331 Pregnant state, incidental: Secondary | ICD-10-CM

## 2014-02-09 DIAGNOSIS — O98519 Other viral diseases complicating pregnancy, unspecified trimester: Secondary | ICD-10-CM

## 2014-02-09 DIAGNOSIS — O99330 Smoking (tobacco) complicating pregnancy, unspecified trimester: Secondary | ICD-10-CM

## 2014-02-09 DIAGNOSIS — E079 Disorder of thyroid, unspecified: Secondary | ICD-10-CM

## 2014-02-09 DIAGNOSIS — O09529 Supervision of elderly multigravida, unspecified trimester: Secondary | ICD-10-CM

## 2014-02-09 DIAGNOSIS — O26849 Uterine size-date discrepancy, unspecified trimester: Secondary | ICD-10-CM

## 2014-02-09 DIAGNOSIS — O0990 Supervision of high risk pregnancy, unspecified, unspecified trimester: Secondary | ICD-10-CM

## 2014-02-09 DIAGNOSIS — O99810 Abnormal glucose complicating pregnancy: Secondary | ICD-10-CM

## 2014-02-09 DIAGNOSIS — O99210 Obesity complicating pregnancy, unspecified trimester: Secondary | ICD-10-CM

## 2014-02-09 LAB — POCT URINALYSIS DIPSTICK
Blood, UA: NEGATIVE
Glucose, UA: NEGATIVE
KETONES UA: NEGATIVE
Leukocytes, UA: NEGATIVE
Nitrite, UA: NEGATIVE
PROTEIN UA: NEGATIVE

## 2014-02-09 LAB — COMPREHENSIVE METABOLIC PANEL
ALT: 9 U/L (ref 0–35)
AST: 12 U/L (ref 0–37)
Albumin: 3.2 g/dL — ABNORMAL LOW (ref 3.5–5.2)
Alkaline Phosphatase: 109 U/L (ref 39–117)
BILIRUBIN TOTAL: 0.4 mg/dL (ref 0.2–1.2)
BUN: 8 mg/dL (ref 6–23)
CO2: 23 mEq/L (ref 19–32)
Calcium: 8.9 mg/dL (ref 8.4–10.5)
Chloride: 106 mEq/L (ref 96–112)
Creat: 0.55 mg/dL (ref 0.50–1.10)
Glucose, Bld: 65 mg/dL — ABNORMAL LOW (ref 70–99)
Potassium: 4.3 mEq/L (ref 3.5–5.3)
Sodium: 135 mEq/L (ref 135–145)
Total Protein: 5.7 g/dL — ABNORMAL LOW (ref 6.0–8.3)

## 2014-02-09 LAB — HEMOGLOBIN A1C
HEMOGLOBIN A1C: 5.9 % — AB (ref <5.7)
Mean Plasma Glucose: 123 mg/dL — ABNORMAL HIGH (ref <117)

## 2014-02-09 NOTE — Progress Notes (Signed)
>  Reactive NST >Blood sugars are excellent >Itching much improved, took 1 benadryl the last 2 nights, fasting labs have been repeated today  BP weight and urine results all reviewed and noted. Patient reports good fetal movement, denies any bleeding and no rupture of membranes symptoms or regular contractions. Patient is without complaints. All questions were answered.

## 2014-02-09 NOTE — Progress Notes (Signed)
  Patient was seen on 02/01/14 for Gestational Diabetes self-management class at the [facility]. The following learning objectives were met by the patient during this course:   States the definition of Gestational Diabetes  States why dietary management is important in controlling blood glucose  Describes the effects of carbohydrates on blood glucose levels  Demonstrates ability to create a balanced meal plan  Demonstrates carbohydrate counting   States when to check blood glucose levels  Demonstrates proper blood glucose monitoring techniques  States the effect of stress and exercise on blood glucose levels  States the importance of limiting caffeine and abstaining from alcohol and smoking  Plan:  Aim for 2 Carb Choices per meal (30 grams) +/- 1 either way for breakfast Aim for 3 Carb Choices per meal (45 grams) +/- 1 either way from lunch and dinner Aim for 1-2 Carbs per snack Begin reading food labels for Total Carbohydrate and sugar grams of foods Consider  increasing your activity level by walking daily as tolerated Begin checking BG before breakfast and 1-2 hours after first bit of breakfast, lunch and dinner after  as directed by MD  Take medication  as directed by MD  Blood glucose monitor given: already had Accu-ck Aviva  Patient instructed to monitor glucose levels: FBS: 60 - <90 1 hour: <140 2 hour: <120  Patient received the following handouts:  Nutrition Diabetes and Pregnancy  Carbohydrate Counting List  Meal Planning worksheet  Patient will be seen for follow-up as needed.

## 2014-02-09 NOTE — Addendum Note (Signed)
Addended by: Criss AlvinePULLIAM, CHRYSTAL G on: 02/09/2014 10:50 AM   Modules accepted: Orders

## 2014-02-11 LAB — BILE ACIDS, TOTAL: Bile Acids Total: 13 umol/L (ref 0–19)

## 2014-02-13 ENCOUNTER — Encounter: Payer: Self-pay | Admitting: Obstetrics and Gynecology

## 2014-02-13 ENCOUNTER — Ambulatory Visit (INDEPENDENT_AMBULATORY_CARE_PROVIDER_SITE_OTHER): Payer: Managed Care, Other (non HMO) | Admitting: Obstetrics and Gynecology

## 2014-02-13 ENCOUNTER — Ambulatory Visit (INDEPENDENT_AMBULATORY_CARE_PROVIDER_SITE_OTHER): Payer: Managed Care, Other (non HMO)

## 2014-02-13 ENCOUNTER — Other Ambulatory Visit: Payer: Self-pay | Admitting: Obstetrics & Gynecology

## 2014-02-13 VITALS — BP 122/62 | Wt 197.2 lb

## 2014-02-13 DIAGNOSIS — O99810 Abnormal glucose complicating pregnancy: Secondary | ICD-10-CM

## 2014-02-13 DIAGNOSIS — O99210 Obesity complicating pregnancy, unspecified trimester: Secondary | ICD-10-CM

## 2014-02-13 DIAGNOSIS — L299 Pruritus, unspecified: Secondary | ICD-10-CM

## 2014-02-13 DIAGNOSIS — E669 Obesity, unspecified: Secondary | ICD-10-CM

## 2014-02-13 DIAGNOSIS — O09529 Supervision of elderly multigravida, unspecified trimester: Secondary | ICD-10-CM

## 2014-02-13 DIAGNOSIS — O26619 Liver and biliary tract disorders in pregnancy, unspecified trimester: Principal | ICD-10-CM

## 2014-02-13 DIAGNOSIS — E079 Disorder of thyroid, unspecified: Secondary | ICD-10-CM

## 2014-02-13 DIAGNOSIS — Z331 Pregnant state, incidental: Secondary | ICD-10-CM

## 2014-02-13 DIAGNOSIS — O99280 Endocrine, nutritional and metabolic diseases complicating pregnancy, unspecified trimester: Secondary | ICD-10-CM

## 2014-02-13 DIAGNOSIS — K831 Obstruction of bile duct: Secondary | ICD-10-CM

## 2014-02-13 DIAGNOSIS — O99330 Smoking (tobacco) complicating pregnancy, unspecified trimester: Secondary | ICD-10-CM

## 2014-02-13 DIAGNOSIS — O98519 Other viral diseases complicating pregnancy, unspecified trimester: Secondary | ICD-10-CM

## 2014-02-13 DIAGNOSIS — Z1389 Encounter for screening for other disorder: Secondary | ICD-10-CM

## 2014-02-13 LAB — POCT URINALYSIS DIPSTICK
Blood, UA: NEGATIVE
GLUCOSE UA: NEGATIVE
Ketones, UA: NEGATIVE
Leukocytes, UA: NEGATIVE
Nitrite, UA: NEGATIVE
Protein, UA: NEGATIVE

## 2014-02-13 NOTE — Progress Notes (Signed)
[id number] female presents today for routine prenatal visit. She denies any complaints at this time. She denies vaginal bleeding or discharge. Plan to induce in 2 weeks. Biweekly testing for now. IOL posted for 02/26/14.  Bile salts fasting 13, will treat as cholestasis of pregnancy

## 2014-02-13 NOTE — Progress Notes (Signed)
U/S(35+1wks)-vtx active fetus BPP 8/8, low NL fluid AFI-4.8cm (Single Deepest Pocket-3.3cm), anterior Gr 2 placenta, FHR-144 bpm, UA Doppler RI-0.68 & 0.62

## 2014-02-14 ENCOUNTER — Other Ambulatory Visit: Payer: Self-pay | Admitting: Obstetrics and Gynecology

## 2014-02-14 ENCOUNTER — Other Ambulatory Visit: Payer: Self-pay | Admitting: Obstetrics & Gynecology

## 2014-02-14 DIAGNOSIS — O99810 Abnormal glucose complicating pregnancy: Secondary | ICD-10-CM

## 2014-02-14 DIAGNOSIS — O09529 Supervision of elderly multigravida, unspecified trimester: Secondary | ICD-10-CM

## 2014-02-15 ENCOUNTER — Telehealth (HOSPITAL_COMMUNITY): Payer: Self-pay | Admitting: *Deleted

## 2014-02-15 NOTE — Telephone Encounter (Signed)
Preadmission screen  

## 2014-02-16 ENCOUNTER — Ambulatory Visit (INDEPENDENT_AMBULATORY_CARE_PROVIDER_SITE_OTHER): Payer: Managed Care, Other (non HMO) | Admitting: Obstetrics & Gynecology

## 2014-02-16 ENCOUNTER — Encounter: Payer: Self-pay | Admitting: Obstetrics & Gynecology

## 2014-02-16 VITALS — BP 120/80 | Wt 197.0 lb

## 2014-02-16 DIAGNOSIS — O99810 Abnormal glucose complicating pregnancy: Secondary | ICD-10-CM

## 2014-02-16 DIAGNOSIS — Z1389 Encounter for screening for other disorder: Secondary | ICD-10-CM

## 2014-02-16 DIAGNOSIS — O98519 Other viral diseases complicating pregnancy, unspecified trimester: Secondary | ICD-10-CM

## 2014-02-16 DIAGNOSIS — E669 Obesity, unspecified: Secondary | ICD-10-CM

## 2014-02-16 DIAGNOSIS — O99330 Smoking (tobacco) complicating pregnancy, unspecified trimester: Secondary | ICD-10-CM

## 2014-02-16 DIAGNOSIS — O0990 Supervision of high risk pregnancy, unspecified, unspecified trimester: Secondary | ICD-10-CM

## 2014-02-16 DIAGNOSIS — Z331 Pregnant state, incidental: Secondary | ICD-10-CM

## 2014-02-16 DIAGNOSIS — O99210 Obesity complicating pregnancy, unspecified trimester: Secondary | ICD-10-CM

## 2014-02-16 DIAGNOSIS — O09529 Supervision of elderly multigravida, unspecified trimester: Secondary | ICD-10-CM

## 2014-02-16 DIAGNOSIS — E079 Disorder of thyroid, unspecified: Secondary | ICD-10-CM

## 2014-02-16 DIAGNOSIS — O99280 Endocrine, nutritional and metabolic diseases complicating pregnancy, unspecified trimester: Secondary | ICD-10-CM

## 2014-02-16 LAB — POCT URINALYSIS DIPSTICK
Blood, UA: NEGATIVE
Glucose, UA: NEGATIVE
KETONES UA: NEGATIVE
LEUKOCYTES UA: NEGATIVE
NITRITE UA: NEGATIVE
Protein, UA: NEGATIVE

## 2014-02-20 ENCOUNTER — Ambulatory Visit (INDEPENDENT_AMBULATORY_CARE_PROVIDER_SITE_OTHER): Payer: Managed Care, Other (non HMO) | Admitting: Women's Health

## 2014-02-20 ENCOUNTER — Other Ambulatory Visit: Payer: Self-pay | Admitting: Obstetrics and Gynecology

## 2014-02-20 ENCOUNTER — Other Ambulatory Visit: Payer: Self-pay | Admitting: Obstetrics & Gynecology

## 2014-02-20 ENCOUNTER — Other Ambulatory Visit: Payer: Managed Care, Other (non HMO)

## 2014-02-20 ENCOUNTER — Encounter: Payer: Self-pay | Admitting: Women's Health

## 2014-02-20 ENCOUNTER — Ambulatory Visit (INDEPENDENT_AMBULATORY_CARE_PROVIDER_SITE_OTHER): Payer: Managed Care, Other (non HMO)

## 2014-02-20 VITALS — BP 110/72 | Temp 98.1°F | Wt 198.0 lb

## 2014-02-20 DIAGNOSIS — L299 Pruritus, unspecified: Secondary | ICD-10-CM

## 2014-02-20 DIAGNOSIS — Z331 Pregnant state, incidental: Secondary | ICD-10-CM

## 2014-02-20 DIAGNOSIS — O99330 Smoking (tobacco) complicating pregnancy, unspecified trimester: Secondary | ICD-10-CM

## 2014-02-20 DIAGNOSIS — O99810 Abnormal glucose complicating pregnancy: Secondary | ICD-10-CM

## 2014-02-20 DIAGNOSIS — E669 Obesity, unspecified: Secondary | ICD-10-CM

## 2014-02-20 DIAGNOSIS — O26849 Uterine size-date discrepancy, unspecified trimester: Secondary | ICD-10-CM

## 2014-02-20 DIAGNOSIS — O09529 Supervision of elderly multigravida, unspecified trimester: Secondary | ICD-10-CM

## 2014-02-20 DIAGNOSIS — O0990 Supervision of high risk pregnancy, unspecified, unspecified trimester: Secondary | ICD-10-CM

## 2014-02-20 DIAGNOSIS — K831 Obstruction of bile duct: Secondary | ICD-10-CM

## 2014-02-20 DIAGNOSIS — O365990 Maternal care for other known or suspected poor fetal growth, unspecified trimester, not applicable or unspecified: Secondary | ICD-10-CM

## 2014-02-20 DIAGNOSIS — O26613 Liver and biliary tract disorders in pregnancy, third trimester: Secondary | ICD-10-CM

## 2014-02-20 DIAGNOSIS — E079 Disorder of thyroid, unspecified: Secondary | ICD-10-CM

## 2014-02-20 DIAGNOSIS — Z1389 Encounter for screening for other disorder: Secondary | ICD-10-CM

## 2014-02-20 DIAGNOSIS — O99210 Obesity complicating pregnancy, unspecified trimester: Secondary | ICD-10-CM

## 2014-02-20 DIAGNOSIS — O288 Other abnormal findings on antenatal screening of mother: Secondary | ICD-10-CM

## 2014-02-20 DIAGNOSIS — IMO0002 Reserved for concepts with insufficient information to code with codable children: Secondary | ICD-10-CM | POA: Insufficient documentation

## 2014-02-20 DIAGNOSIS — O99280 Endocrine, nutritional and metabolic diseases complicating pregnancy, unspecified trimester: Secondary | ICD-10-CM

## 2014-02-20 DIAGNOSIS — O98519 Other viral diseases complicating pregnancy, unspecified trimester: Secondary | ICD-10-CM

## 2014-02-20 LAB — POCT URINALYSIS DIPSTICK
Glucose, UA: NEGATIVE
KETONES UA: NEGATIVE
LEUKOCYTES UA: NEGATIVE
Nitrite, UA: NEGATIVE
Protein, UA: NEGATIVE

## 2014-02-20 LAB — OB RESULTS CONSOLE GC/CHLAMYDIA
CHLAMYDIA, DNA PROBE: NEGATIVE
GC PROBE AMP, GENITAL: NEGATIVE

## 2014-02-20 NOTE — Progress Notes (Signed)
U/S(36+1wks)-vtx, active fetus, EFW 4 lb 12 oz (3+%tile), fluid WNL AFI-5.0 & 5.7cm, FHR-133 & 121 BPM, UA Doppler RI-0.59 & 0.53, female fetus, anterior Gr 2 placenta

## 2014-02-20 NOTE — Progress Notes (Signed)
Reports good fm. Denies uc's, lof, vb, uti s/s.  Head cold x 3d, feels like it's getting better. Runny nose. Denies fever/chills, sore throat, cough, earache. HRRR, LCTAB. FBS all <90, only 1-2 2hr >120. Had baby shower and ate cake. Itching not bad, r/b benadryl. Discussed today's u/s and pt's hx w/ JVF: now IUGR @ 3.5%, AFI borderline low @ 5-5.7cm, great dopplers. He reviewed all previous u/s as well as today's, AFI has actually increased slightly since last week. Will bring back in on Thurs for BPP/AFI/Dopp, and already has IOL scheduled for 3/22 @ 37wks. Reviewed ptl s/s, fkc.  All questions answered. F/U in 3d for u/s and visit. GBS today. Had fasting bile acids drawn this am. Will try to add TSH onto that blood.

## 2014-02-20 NOTE — Patient Instructions (Signed)
Fetal Movement Counts °Patient Name: __________________________________________________ Patient Due Date: ____________________ °Performing a fetal movement count is highly recommended in high-risk pregnancies, but it is good for every pregnant woman to do. Your caregiver may ask you to start counting fetal movements at 28 weeks of the pregnancy. Fetal movements often increase: °· After eating a full meal. °· After physical activity. °· After eating or drinking something sweet or cold. °· At rest. °Pay attention to when you feel the baby is most active. This will help you notice a pattern of your baby's sleep and wake cycles and what factors contribute to an increase in fetal movement. It is important to perform a fetal movement count at the same time each day when your baby is normally most active.  °HOW TO COUNT FETAL MOVEMENTS °1. Find a quiet and comfortable area to sit or lie down on your left side. Lying on your left side provides the best blood and oxygen circulation to your baby. °2. Write down the day and time on a sheet of paper or in a journal. °3. Start counting kicks, flutters, swishes, rolls, or jabs in a 2 hour period. You should feel at least 10 movements within 2 hours. °4. If you do not feel 10 movements in 2 hours, wait 2 3 hours and count again. Look for a change in the pattern or not enough counts in 2 hours. °SEEK MEDICAL CARE IF: °· You feel less than 10 counts in 2 hours, tried twice. °· There is no movement in over an hour. °· The pattern is changing or taking longer each day to reach 10 counts in 2 hours. °· You feel the baby is not moving as he or she usually does. °Date: ____________ Movements: ____________ Start time: ____________ Finish time: ____________  °Date: ____________ Movements: ____________ Start time: ____________ Finish time: ____________ °Date: ____________ Movements: ____________ Start time: ____________ Finish time: ____________ °Date: ____________ Movements: ____________  Start time: ____________ Finish time: ____________ °Date: ____________ Movements: ____________ Start time: ____________ Finish time: ____________ °Date: ____________ Movements: ____________ Start time: ____________ Finish time: ____________ °Date: ____________ Movements: ____________ Start time: ____________ Finish time: ____________ °Date: ____________ Movements: ____________ Start time: ____________ Finish time: ____________  °Date: ____________ Movements: ____________ Start time: ____________ Finish time: ____________ °Date: ____________ Movements: ____________ Start time: ____________ Finish time: ____________ °Date: ____________ Movements: ____________ Start time: ____________ Finish time: ____________ °Date: ____________ Movements: ____________ Start time: ____________ Finish time: ____________ °Date: ____________ Movements: ____________ Start time: ____________ Finish time: ____________ °Date: ____________ Movements: ____________ Start time: ____________ Finish time: ____________ °Date: ____________ Movements: ____________ Start time: ____________ Finish time: ____________  °Date: ____________ Movements: ____________ Start time: ____________ Finish time: ____________ °Date: ____________ Movements: ____________ Start time: ____________ Finish time: ____________ °Date: ____________ Movements: ____________ Start time: ____________ Finish time: ____________ °Date: ____________ Movements: ____________ Start time: ____________ Finish time: ____________ °Date: ____________ Movements: ____________ Start time: ____________ Finish time: ____________ °Date: ____________ Movements: ____________ Start time: ____________ Finish time: ____________ °Date: ____________ Movements: ____________ Start time: ____________ Finish time: ____________  °Date: ____________ Movements: ____________ Start time: ____________ Finish time: ____________ °Date: ____________ Movements: ____________ Start time: ____________ Finish time:  ____________ °Date: ____________ Movements: ____________ Start time: ____________ Finish time: ____________ °Date: ____________ Movements: ____________ Start time: ____________ Finish time: ____________ °Date: ____________ Movements: ____________ Start time: ____________ Finish time: ____________ °Date: ____________ Movements: ____________ Start time: ____________ Finish time: ____________ °Date: ____________ Movements: ____________ Start time: ____________ Finish time: ____________  °Date: ____________ Movements: ____________ Start time: ____________ Finish   time: ____________ °Date: ____________ Movements: ____________ Start time: ____________ Finish time: ____________ °Date: ____________ Movements: ____________ Start time: ____________ Finish time: ____________ °Date: ____________ Movements: ____________ Start time: ____________ Finish time: ____________ °Date: ____________ Movements: ____________ Start time: ____________ Finish time: ____________ °Date: ____________ Movements: ____________ Start time: ____________ Finish time: ____________ °Date: ____________ Movements: ____________ Start time: ____________ Finish time: ____________  °Date: ____________ Movements: ____________ Start time: ____________ Finish time: ____________ °Date: ____________ Movements: ____________ Start time: ____________ Finish time: ____________ °Date: ____________ Movements: ____________ Start time: ____________ Finish time: ____________ °Date: ____________ Movements: ____________ Start time: ____________ Finish time: ____________ °Date: ____________ Movements: ____________ Start time: ____________ Finish time: ____________ °Date: ____________ Movements: ____________ Start time: ____________ Finish time: ____________ °Date: ____________ Movements: ____________ Start time: ____________ Finish time: ____________  °Date: ____________ Movements: ____________ Start time: ____________ Finish time: ____________ °Date: ____________ Movements:  ____________ Start time: ____________ Finish time: ____________ °Date: ____________ Movements: ____________ Start time: ____________ Finish time: ____________ °Date: ____________ Movements: ____________ Start time: ____________ Finish time: ____________ °Date: ____________ Movements: ____________ Start time: ____________ Finish time: ____________ °Date: ____________ Movements: ____________ Start time: ____________ Finish time: ____________ °Date: ____________ Movements: ____________ Start time: ____________ Finish time: ____________  °Date: ____________ Movements: ____________ Start time: ____________ Finish time: ____________ °Date: ____________ Movements: ____________ Start time: ____________ Finish time: ____________ °Date: ____________ Movements: ____________ Start time: ____________ Finish time: ____________ °Date: ____________ Movements: ____________ Start time: ____________ Finish time: ____________ °Date: ____________ Movements: ____________ Start time: ____________ Finish time: ____________ °Date: ____________ Movements: ____________ Start time: ____________ Finish time: ____________ °Document Released: 12/24/2006 Document Revised: 11/10/2012 Document Reviewed: 09/20/2012 °ExitCare® Patient Information ©2014 [employer]. ° °Preterm Labor Information °Preterm labor is when labor starts at less than 37 weeks of pregnancy. The normal length of a pregnancy is 39 to 41 weeks. °CAUSES °Often, there is no identifiable underlying cause as to why a woman goes into preterm labor. One of the most common known causes of preterm labor is infection. Infections of the uterus, cervix, vagina, amniotic sac, bladder, kidney, or even the lungs (pneumonia) can cause labor to start. Other suspected causes of preterm labor include:  °· Urogenital infections, such as yeast infections and bacterial vaginosis.   °· Uterine abnormalities (uterine shape, uterine septum, fibroids, or bleeding from the placenta).   °· A cervix that  has been operated on (it may fail to stay closed).   °· Malformations in the fetus.   °· Multiple gestations (twins, triplets, and so on).   °· Breakage of the amniotic sac.   °RISK FACTORS °· Having a previous history of preterm labor.   °· Having premature rupture of membranes (PROM).   °· Having a placenta that covers the opening of the cervix (placenta previa).   °· Having a placenta that separates from the uterus (placental abruption).   °· Having a cervix that is too weak to hold the fetus in the uterus (incompetent cervix).   °· Having too much fluid in the amniotic sac (polyhydramnios).   °· Taking illegal drugs or smoking while pregnant.   °· Not gaining enough weight while pregnant.   °· Being younger than 18 and older than 40 years old.   °· Having a low socioeconomic status.   °· Being African American. °SYMPTOMS °Signs and symptoms of preterm labor include:  °· Menstrual-like cramps, abdominal pain, or back pain. °· Uterine contractions that are regular, as frequent as six in an hour, regardless of their intensity (may be mild or painful). °· Contractions that start on the top of the uterus and spread down to the lower abdomen and   back.   °· A sense of increased pelvic pressure.   °· A watery or bloody mucus discharge that comes from the vagina.   °TREATMENT °Depending on the length of the pregnancy and other circumstances, your health care provider may suggest bed rest. If necessary, there are medicines that can be given to stop contractions and to mature the fetal lungs. If labor happens before 34 weeks of pregnancy, a prolonged hospital stay may be recommended. Treatment depends on the condition of both you and the fetus.  °WHAT SHOULD YOU DO IF YOU THINK YOU ARE IN PRETERM LABOR? °Call your health care provider right away. You will need to go to the hospital to get checked immediately. °HOW CAN YOU PREVENT PRETERM LABOR IN FUTURE PREGNANCIES? °You should:  °· Stop smoking if you smoke.  °· Maintain  healthy weight gain and avoid chemicals and drugs that are not necessary. °· Be watchful for any type of infection. °· Inform your health care provider if you have a known history of preterm labor. °Document Released: 02/14/2004 Document Revised: 07/27/2013 Document Reviewed: 12/27/2012 °ExitCare® Patient Information ©2014 [employer]. ° °

## 2014-02-21 ENCOUNTER — Encounter: Payer: Self-pay | Admitting: Women's Health

## 2014-02-21 LAB — GC/CHLAMYDIA PROBE AMP
CT Probe RNA: NEGATIVE
GC Probe RNA: NEGATIVE

## 2014-02-21 LAB — TSH: TSH: 0.356 u[IU]/mL (ref 0.350–4.500)

## 2014-02-22 ENCOUNTER — Other Ambulatory Visit: Payer: Self-pay | Admitting: Obstetrics and Gynecology

## 2014-02-22 DIAGNOSIS — O09529 Supervision of elderly multigravida, unspecified trimester: Secondary | ICD-10-CM

## 2014-02-22 DIAGNOSIS — O99810 Abnormal glucose complicating pregnancy: Secondary | ICD-10-CM

## 2014-02-22 DIAGNOSIS — O365990 Maternal care for other known or suspected poor fetal growth, unspecified trimester, not applicable or unspecified: Secondary | ICD-10-CM

## 2014-02-22 LAB — BILE ACIDS, TOTAL: Bile Acids Total: 6 umol/L (ref 0–19)

## 2014-02-22 LAB — STREP B DNA PROBE: GBSP: NEGATIVE

## 2014-02-23 ENCOUNTER — Other Ambulatory Visit: Payer: Self-pay | Admitting: Obstetrics and Gynecology

## 2014-02-23 ENCOUNTER — Ambulatory Visit (INDEPENDENT_AMBULATORY_CARE_PROVIDER_SITE_OTHER): Payer: Managed Care, Other (non HMO) | Admitting: Obstetrics & Gynecology

## 2014-02-23 ENCOUNTER — Encounter: Payer: Self-pay | Admitting: Obstetrics & Gynecology

## 2014-02-23 ENCOUNTER — Other Ambulatory Visit: Payer: Managed Care, Other (non HMO) | Admitting: Advanced Practice Midwife

## 2014-02-23 ENCOUNTER — Ambulatory Visit (INDEPENDENT_AMBULATORY_CARE_PROVIDER_SITE_OTHER): Payer: Managed Care, Other (non HMO)

## 2014-02-23 VITALS — BP 130/80 | Wt 196.0 lb

## 2014-02-23 DIAGNOSIS — O98519 Other viral diseases complicating pregnancy, unspecified trimester: Secondary | ICD-10-CM

## 2014-02-23 DIAGNOSIS — O365990 Maternal care for other known or suspected poor fetal growth, unspecified trimester, not applicable or unspecified: Secondary | ICD-10-CM

## 2014-02-23 DIAGNOSIS — E669 Obesity, unspecified: Secondary | ICD-10-CM

## 2014-02-23 DIAGNOSIS — O99810 Abnormal glucose complicating pregnancy: Secondary | ICD-10-CM

## 2014-02-23 DIAGNOSIS — O09529 Supervision of elderly multigravida, unspecified trimester: Secondary | ICD-10-CM

## 2014-02-23 DIAGNOSIS — O288 Other abnormal findings on antenatal screening of mother: Secondary | ICD-10-CM

## 2014-02-23 DIAGNOSIS — O99210 Obesity complicating pregnancy, unspecified trimester: Secondary | ICD-10-CM

## 2014-02-23 DIAGNOSIS — IMO0002 Reserved for concepts with insufficient information to code with codable children: Secondary | ICD-10-CM

## 2014-02-23 DIAGNOSIS — O99330 Smoking (tobacco) complicating pregnancy, unspecified trimester: Secondary | ICD-10-CM

## 2014-02-23 DIAGNOSIS — Z1389 Encounter for screening for other disorder: Secondary | ICD-10-CM

## 2014-02-23 DIAGNOSIS — O24419 Gestational diabetes mellitus in pregnancy, unspecified control: Secondary | ICD-10-CM

## 2014-02-23 DIAGNOSIS — E079 Disorder of thyroid, unspecified: Secondary | ICD-10-CM

## 2014-02-23 DIAGNOSIS — O99280 Endocrine, nutritional and metabolic diseases complicating pregnancy, unspecified trimester: Secondary | ICD-10-CM

## 2014-02-23 DIAGNOSIS — Z331 Pregnant state, incidental: Secondary | ICD-10-CM

## 2014-02-23 LAB — POCT URINALYSIS DIPSTICK
Blood, UA: NEGATIVE
Glucose, UA: NEGATIVE
KETONES UA: NEGATIVE
Leukocytes, UA: NEGATIVE
Nitrite, UA: NEGATIVE
PROTEIN UA: NEGATIVE

## 2014-02-23 NOTE — Progress Notes (Signed)
Sonogram is reviewed and report is done:  Oligohydramnios in a fetus  At 3.5%tile growth, normal Dopplers and fetal assessment otherwise Induction scheduled on 02/26/2014  Bile acids dropped to 6  BP weight and urine results all reviewed and noted. Patient reports good fetal movement, denies any bleeding and no rupture of membranes symptoms or regular contractions. Patient is without complaints. All questions were answered.

## 2014-02-23 NOTE — Addendum Note (Signed)
Addended by: Colen DarlingYOUNG, Esta Carmon S on: 02/23/2014 05:03 PM   Modules accepted: Orders

## 2014-02-23 NOTE — Progress Notes (Addendum)
U/S(36+4wks)-vtx active fetus, FHR- 132 BPM, anterior Gr 3 placenta, female fetus, Oligohydramnios AFI- 4.3 & 4.0cm, BPP 8/8, UA Doppler RI-0.59 & 0.54

## 2014-02-23 NOTE — H&P (Signed)
Vanessa Powell is a 38 y.o. female G3 P2002 presenting for induction of labor at 37.0 wk for SGA infant 3rd percentile with good doppler flow RI of 0.59, and 0.54 with BPP 8/8 on Thursday, 3/19. Pregnancy course notable also for oligohydramnios, with AFI 4.0, 4.2 , essentially stable over the past 3 weeks, and also notable for hypothyroidism, on synthroid with corrected TSH to 0.356 on 3/16,  There is Adv Mat age as well, and pt had an elevated GTT , placed on Metformin 500 q am. The pregnancy began as a TWIN PREGNANCY, with vanishing twin identified by 8 wk 5 days, located in the posterior uterine fundus, There were no episodes of bleeding. The patient had a 2 week episode of pruritis in 3rd trimester, with Bile acids staying wnl, with levels of 11-13 , then 6, with response to benadryl. History OB History   Grav Para Term Preterm Abortions TAB SAB Ect Mult Living   3 2 2       2      Past Medical History  Diagnosis Date  . Abscess   . Thyroid disease   . Gestational diabetes mellitus, antepartum    Past Surgical History  Procedure Laterality Date  . Ankle surgery    . Cholecystectomy     Family History: family history includes Cancer in her maternal grandfather, other, and paternal grandmother; Diabetes in her mother; Hypertension in her maternal grandfather and mother; Kidney disease in her other; Thyroid disease in her maternal grandmother. Social History:  reports that she has been smoking Cigarettes.  She has been smoking about 0.50 packs per day. She has never used smokeless tobacco. She reports that she does not drink alcohol or use illicit drugs.   Prenatal Transfer Tool  Maternal Diabetes: Yes:  Diabetes Type:  Diet controlled then on Metformin 500 q am. Genetic Screening: Normal Maternal Ultrasounds/Referrals: Abnormal:  Findings:   IUGR, Other: oligohydramnios Fetal Ultrasounds or other Referrals:  None Maternal Substance Abuse:  No Significant Maternal Medications:  Meds  include: Other:  metformin 500 q am, synthroid 175 mcg qd, Benadryl 25 prn Significant Maternal Lab Results:  Lab values include: Group B Strep negative, Other:  Tsh initially 10, reduced to 0.356  Other Comments:  Nonspecific pruritis in third trimester, with bile acids 11->13-> 6 Desires BTL   Review of Systems  Constitutional: Negative.        Last menstrual period 06/12/2013. Exam Physical Exam  Constitutional: She appears well-developed and well-nourished.  HENT:  Head: Normocephalic.  Eyes: Pupils are equal, round, and reactive to light.  Neck: Normal range of motion.  Cardiovascular: Normal rate.   Respiratory: Effort normal.  GI: Soft. Bowel sounds are normal.  Gravid uterus , size < dates,    EFW 4 lb 12 oz on 3.16.15 Cervical exam to be done at admission, then consideration of cervical ripening vs pitocin IOL addressed  Prenatal labs: ABO, Rh: --/--/AB POS (09/04 0803) Antibody: NEG (01/15 0920) Rubella: Immune (08/21 0000) RPR: NON REAC (01/15 0920)  HBsAg: Negative (08/21 0000)  HIV: NON REACTIVE (01/15 0920)  GBS: NEGATIVE (03/16 1709)   Assessment/Plan: 1, IOL for iugr 3%ile with oligohydramnios and good doppler studies 02/23/14 2  AMA 3. hypothroidism , corrected 4. Resolved pruritis  5. Hx vanishing twin first trimester<8 w 5d 6. Desires BTL 7. A2 DM with good control on metformin 500 q am. Follow CBG q 4h in early labor, adjust as indicated.  Neldon Shepard V 02/23/2014, 10:11 PM

## 2014-02-26 ENCOUNTER — Encounter (HOSPITAL_COMMUNITY): Payer: Managed Care, Other (non HMO) | Admitting: Anesthesiology

## 2014-02-26 ENCOUNTER — Inpatient Hospital Stay (HOSPITAL_COMMUNITY): Payer: Managed Care, Other (non HMO) | Admitting: Anesthesiology

## 2014-02-26 ENCOUNTER — Inpatient Hospital Stay (HOSPITAL_COMMUNITY)
Admission: RE | Admit: 2014-02-26 | Discharge: 2014-03-01 | DRG: 775 | Disposition: A | Discharge status: Home / Self Care | Payer: Managed Care, Other (non HMO) | Source: Ambulatory Visit | Attending: Obstetrics and Gynecology | Admitting: Obstetrics and Gynecology

## 2014-02-26 ENCOUNTER — Encounter (HOSPITAL_COMMUNITY): Payer: Self-pay

## 2014-02-26 ENCOUNTER — Inpatient Hospital Stay (HOSPITAL_COMMUNITY): Payer: Managed Care, Other (non HMO)

## 2014-02-26 VITALS — BP 141/87 | HR 81 | Temp 98.0°F | Resp 18 | Ht 65.0 in | Wt 196.0 lb

## 2014-02-26 DIAGNOSIS — K831 Obstruction of bile duct: Secondary | ICD-10-CM

## 2014-02-26 DIAGNOSIS — O99814 Abnormal glucose complicating childbirth: Secondary | ICD-10-CM | POA: Diagnosis present

## 2014-02-26 DIAGNOSIS — O99284 Endocrine, nutritional and metabolic diseases complicating childbirth: Secondary | ICD-10-CM

## 2014-02-26 DIAGNOSIS — Z302 Encounter for sterilization: Secondary | ICD-10-CM

## 2014-02-26 DIAGNOSIS — O26613 Liver and biliary tract disorders in pregnancy, third trimester: Secondary | ICD-10-CM

## 2014-02-26 DIAGNOSIS — O26849 Uterine size-date discrepancy, unspecified trimester: Secondary | ICD-10-CM

## 2014-02-26 DIAGNOSIS — IMO0002 Reserved for concepts with insufficient information to code with codable children: Secondary | ICD-10-CM | POA: Diagnosis present

## 2014-02-26 DIAGNOSIS — O24419 Gestational diabetes mellitus in pregnancy, unspecified control: Secondary | ICD-10-CM

## 2014-02-26 DIAGNOSIS — O99334 Smoking (tobacco) complicating childbirth: Secondary | ICD-10-CM | POA: Diagnosis present

## 2014-02-26 DIAGNOSIS — O4100X0 Oligohydramnios, unspecified trimester, not applicable or unspecified: Principal | ICD-10-CM | POA: Diagnosis present

## 2014-02-26 DIAGNOSIS — O288 Other abnormal findings on antenatal screening of mother: Secondary | ICD-10-CM

## 2014-02-26 DIAGNOSIS — O365990 Maternal care for other known or suspected poor fetal growth, unspecified trimester, not applicable or unspecified: Secondary | ICD-10-CM | POA: Diagnosis present

## 2014-02-26 DIAGNOSIS — E079 Disorder of thyroid, unspecified: Secondary | ICD-10-CM | POA: Diagnosis present

## 2014-02-26 DIAGNOSIS — O09529 Supervision of elderly multigravida, unspecified trimester: Secondary | ICD-10-CM

## 2014-02-26 DIAGNOSIS — Z8759 Personal history of other complications of pregnancy, childbirth and the puerperium: Secondary | ICD-10-CM

## 2014-02-26 DIAGNOSIS — E039 Hypothyroidism, unspecified: Secondary | ICD-10-CM | POA: Diagnosis present

## 2014-02-26 DIAGNOSIS — O0940 Supervision of pregnancy with grand multiparity, unspecified trimester: Secondary | ICD-10-CM

## 2014-02-26 HISTORY — DX: Hypothyroidism, unspecified: E03.9

## 2014-02-26 LAB — GLUCOSE, CAPILLARY
GLUCOSE-CAPILLARY: 99 mg/dL (ref 70–99)
Glucose-Capillary: 116 mg/dL — ABNORMAL HIGH (ref 70–99)
Glucose-Capillary: 67 mg/dL — ABNORMAL LOW (ref 70–99)
Glucose-Capillary: 148 mg/dL — ABNORMAL HIGH (ref 70–99)
Glucose-Capillary: 100 mg/dL — ABNORMAL HIGH (ref 70–99)

## 2014-02-26 LAB — CBC
HCT: 36.5 % (ref 36.0–46.0)
HCT: 39.2 % (ref 36.0–46.0)
HEMOGLOBIN: 12.5 g/dL (ref 12.0–15.0)
Hemoglobin: 13.3 g/dL (ref 12.0–15.0)
MCH: 32.6 pg (ref 26.0–34.0)
MCH: 32.3 pg (ref 26.0–34.0)
MCHC: 34.2 g/dL (ref 30.0–36.0)
MCHC: 33.9 g/dL (ref 30.0–36.0)
MCV: 95.1 fL (ref 78.0–100.0)
MCV: 95.1 fL (ref 78.0–100.0)
PLATELETS: 216 10*3/uL (ref 150–400)
Platelets: 209 10*3/uL (ref 150–400)
RBC: 3.84 MIL/uL — ABNORMAL LOW (ref 3.87–5.11)
RBC: 4.12 MIL/uL (ref 3.87–5.11)
RDW: 13.9 % (ref 11.5–15.5)
RDW: 13.8 % (ref 11.5–15.5)
WBC: 13.4 10*3/uL — ABNORMAL HIGH (ref 4.0–10.5)
WBC: 13.9 10*3/uL — AB (ref 4.0–10.5)

## 2014-02-26 LAB — COMPREHENSIVE METABOLIC PANEL
ALT: 8 U/L (ref 0–35)
AST: 12 U/L (ref 0–37)
Albumin: 2.2 g/dL — ABNORMAL LOW (ref 3.5–5.2)
Alkaline Phosphatase: 147 U/L — ABNORMAL HIGH (ref 39–117)
BUN: 11 mg/dL (ref 6–23)
CO2: 21 mEq/L (ref 19–32)
Calcium: 9.4 mg/dL (ref 8.4–10.5)
Chloride: 102 mEq/L (ref 96–112)
Creatinine, Ser: 0.61 mg/dL (ref 0.50–1.10)
GFR calc Af Amer: >90 mL/min (ref >90)
GFR calc non Af Amer: >90 mL/min (ref >90)
GLUCOSE: 110 mg/dL — AB (ref 70–99)
Potassium: 3.9 mEq/L (ref 3.7–5.3)
SODIUM: 137 meq/L (ref 137–147)
Total Bilirubin: <0.2 mg/dL — ABNORMAL LOW (ref 0.3–1.2)
Total Protein: 5.8 g/dL — ABNORMAL LOW (ref 6.0–8.3)

## 2014-02-26 LAB — PROTEIN / CREATININE RATIO, URINE
CREATININE, URINE: 120.33 mg/dL
PROTEIN CREATININE RATIO: 0.21 — AB (ref 0.00–0.15)
Total Protein, Urine: 25.6 mg/dL

## 2014-02-26 LAB — RPR: RPR Ser Ql: NONREACTIVE

## 2014-02-26 MED ORDER — PHENYLEPHRINE 40 MCG/ML (10ML) SYRINGE FOR IV PUSH (FOR BLOOD PRESSURE SUPPORT)
PREFILLED_SYRINGE | INTRAVENOUS | Status: AC
  Filled 2014-02-26: qty 10

## 2014-02-26 MED ORDER — FENTANYL 2.5 MCG/ML BUPIVACAINE 1/10 % EPIDURAL INFUSION (WH - ANES)
INTRAMUSCULAR | Status: DC | PRN
  Administered 2014-02-26: 14 mL/h via EPIDURAL

## 2014-02-26 MED ORDER — PHENYLEPHRINE 40 MCG/ML (10ML) SYRINGE FOR IV PUSH (FOR BLOOD PRESSURE SUPPORT)
80.0000 ug | PREFILLED_SYRINGE | INTRAVENOUS | Status: DC | PRN
  Filled 2014-02-26: qty 2

## 2014-02-26 MED ORDER — DIPHENHYDRAMINE HCL 50 MG/ML IJ SOLN: 12.5000 mg | INTRAMUSCULAR | Status: DC | PRN

## 2014-02-26 MED ORDER — EPHEDRINE 5 MG/ML INJ
INTRAVENOUS | Status: AC
  Filled 2014-02-26: qty 4

## 2014-02-26 MED ORDER — OXYTOCIN BOLUS FROM INFUSION
500.0000 mL | INTRAVENOUS | Status: DC
  Administered 2014-02-27: 500 mL via INTRAVENOUS

## 2014-02-26 MED ORDER — IBUPROFEN 600 MG PO TABS: 600.0000 mg | ORAL_TABLET | Freq: Four times a day (QID) | ORAL | Status: DC | PRN

## 2014-02-26 MED ORDER — TERBUTALINE SULFATE 1 MG/ML IJ SOLN: 0.2500 mg | Freq: Once | INTRAMUSCULAR | Status: AC | PRN

## 2014-02-26 MED ORDER — FENTANYL CITRATE 0.05 MG/ML IJ SOLN
50.0000 ug | INTRAMUSCULAR | Status: DC | PRN
Start: 2014-02-26 — End: 2014-02-27
  Administered 2014-02-26: 19:00:00 via INTRAVENOUS
  Filled 2014-02-26: qty 2

## 2014-02-26 MED ORDER — FLEET ENEMA 7-19 GM/118ML RE ENEM: 1.0000 | ENEMA | RECTAL | Status: DC | PRN

## 2014-02-26 MED ORDER — LACTATED RINGERS IV SOLN
INTRAVENOUS | Status: DC
  Administered 2014-02-26 – 2014-02-27 (×3): via INTRAVENOUS

## 2014-02-26 MED ORDER — MISOPROSTOL 25 MCG QUARTER TABLET
25.0000 ug | ORAL_TABLET | ORAL | Status: DC | PRN
  Administered 2014-02-26 (×2): 25 ug via VAGINAL
  Filled 2014-02-26 (×2): qty 0.25

## 2014-02-26 MED ORDER — LACTATED RINGERS IV SOLN: 500.0000 mL | INTRAVENOUS | Status: DC | PRN

## 2014-02-26 MED ORDER — LACTATED RINGERS IV SOLN: INTRAVENOUS | Status: DC

## 2014-02-26 MED ORDER — ONDANSETRON HCL 4 MG/2ML IJ SOLN
4.0000 mg | Freq: Four times a day (QID) | INTRAMUSCULAR | Status: DC | PRN
  Administered 2014-02-27: 4 mg via INTRAVENOUS
  Filled 2014-02-26: qty 2

## 2014-02-26 MED ORDER — LACTATED RINGERS IV SOLN
500.0000 mL | Freq: Once | INTRAVENOUS | Status: AC
  Administered 2014-02-26: 500 mL via INTRAVENOUS

## 2014-02-26 MED ORDER — ACETAMINOPHEN 325 MG PO TABS: 650.0000 mg | ORAL_TABLET | ORAL | Status: DC | PRN

## 2014-02-26 MED ORDER — PHENYLEPHRINE 40 MCG/ML (10ML) SYRINGE FOR IV PUSH (FOR BLOOD PRESSURE SUPPORT)
80.0000 ug | PREFILLED_SYRINGE | INTRAVENOUS | Status: DC | PRN
  Administered 2014-02-26: 80 ug via INTRAVENOUS
  Filled 2014-02-26: qty 2

## 2014-02-26 MED ORDER — EPHEDRINE 5 MG/ML INJ
10.0000 mg | INTRAVENOUS | Status: DC | PRN
  Filled 2014-02-26: qty 2

## 2014-02-26 MED ORDER — FENTANYL 2.5 MCG/ML BUPIVACAINE 1/10 % EPIDURAL INFUSION (WH - ANES)
INTRAMUSCULAR | Status: AC
  Filled 2014-02-26: qty 125

## 2014-02-26 MED ORDER — LIDOCAINE HCL (PF) 1 % IJ SOLN
INTRAMUSCULAR | Status: DC | PRN
  Administered 2014-02-26 (×3): 5 mL

## 2014-02-26 MED ORDER — CITRIC ACID-SODIUM CITRATE 334-500 MG/5ML PO SOLN
30.0000 mL | ORAL | Status: DC | PRN
  Administered 2014-02-26: 30 mL via ORAL
  Filled 2014-02-26: qty 15

## 2014-02-26 MED ORDER — LIDOCAINE HCL (PF) 1 % IJ SOLN
30.0000 mL | INTRAMUSCULAR | Status: DC | PRN
  Filled 2014-02-26: qty 30

## 2014-02-26 MED ORDER — ZOLPIDEM TARTRATE 5 MG PO TABS
5.0000 mg | ORAL_TABLET | Freq: Every evening | ORAL | Status: DC | PRN
Start: 2014-02-26 — End: 2014-02-27

## 2014-02-26 MED ORDER — FENTANYL 2.5 MCG/ML BUPIVACAINE 1/10 % EPIDURAL INFUSION (WH - ANES): 14.0000 mL/h | INTRAMUSCULAR | Status: DC | PRN

## 2014-02-26 MED ORDER — OXYTOCIN 40 UNITS IN LACTATED RINGERS INFUSION - SIMPLE MED
1.0000 m[IU]/min | INTRAVENOUS | Status: DC
  Administered 2014-02-26: 2 m[IU]/min via INTRAVENOUS
  Filled 2014-02-26: qty 1000

## 2014-02-26 MED ORDER — OXYCODONE-ACETAMINOPHEN 5-325 MG PO TABS: 1.0000 | ORAL_TABLET | ORAL | Status: DC | PRN

## 2014-02-26 MED ORDER — OXYTOCIN 40 UNITS IN LACTATED RINGERS INFUSION - SIMPLE MED: 62.5000 mL/h | INTRAVENOUS | Status: DC

## 2014-02-26 NOTE — Progress Notes (Signed)
I am assuming care of this patient at this time. 

## 2014-02-26 NOTE — Anesthesia Procedure Notes (Signed)
Epidural Patient location during procedure: OB  Staffing Anesthesiologist: Hagen Bohorquez Performed by: anesthesiologist   Preanesthetic Checklist Completed: patient identified, site marked, surgical consent, pre-op evaluation, timeout performed, IV checked, risks and benefits discussed and monitors and equipment checked  Epidural Patient position: sitting Prep: ChloraPrep Patient monitoring: heart rate, continuous pulse ox and blood pressure Approach: right paramedian Location: L3-L4 Injection technique: LOR saline  Needle:  Needle type: Tuohy  Needle gauge: 17 G Needle length: 9 cm and 9 Needle insertion depth: 5 cm Catheter type: closed end flexible Catheter size: 20 Guage Catheter at skin depth: 10 cm Test dose: negative  Assessment Events: blood not aspirated, injection not painful, no injection resistance, negative IV test and no paresthesia  Additional Notes   Patient tolerated the insertion well without complications.   

## 2014-02-26 NOTE — Anesthesia Preprocedure Evaluation (Signed)
Anesthesia Evaluation  Patient identified by MRN, date of birth, ID band Patient awake    Reviewed: Allergy & Precautions, H&P , NPO status , Patient's Chart, lab work & pertinent test results  Airway Mallampati: II TM Distance: >3 FB Neck ROM: Full    Dental no notable dental hx.    Pulmonary neg pulmonary ROS, Current Smoker,  breath sounds clear to auscultation  Pulmonary exam normal       Cardiovascular negative cardio ROS  Rhythm:Regular Rate:Normal     Neuro/Psych negative neurological ROS  negative psych ROS   GI/Hepatic negative GI ROS, Neg liver ROS,   Endo/Other  diabetes, GestationalHypothyroidism   Renal/GU negative Renal ROS  negative genitourinary   Musculoskeletal negative musculoskeletal ROS (+)   Abdominal   Peds negative pediatric ROS (+)  Hematology negative hematology ROS (+)   Anesthesia Other Findings   Reproductive/Obstetrics negative OB ROS (+) Pregnancy                           Anesthesia Physical Anesthesia Plan  ASA: II  Anesthesia Plan: Epidural   Post-op Pain Management:    Induction:   Airway Management Planned:   Additional Equipment:   Intra-op Plan:   Post-operative Plan:   Informed Consent: I have reviewed the patients History and Physical, chart, labs and discussed the procedure including the risks, benefits and alternatives for the proposed anesthesia with the patient or authorized representative who has indicated his/her understanding and acceptance.   Dental advisory given  Plan Discussed with:   Anesthesia Plan Comments:         Anesthesia Quick Evaluation

## 2014-02-26 NOTE — H&P (Signed)
  H&P from Dr Emelda FearFerguson reviewed.  Agree with his notes  Patient states Monday US showed vertex fetus, but Thursday it was breech.   Cervix closed/80%/-2/?presenting part (feels hard, but not exactly round)  Will recheck US for presentation

## 2014-02-26 NOTE — Progress Notes (Signed)
   Subjective: Pt sleeping.    Objective: BP 147/93  Pulse 83  Temp(Src) 97.2 F (36.2 C) (Oral)  Resp 18  Ht 5\' 5"  (1.651 m)  Wt 88.905 kg (196 lb)  BMI 32.62 kg/m2  LMP 06/12/2013      FHT:  FHR: 140's bpm, variability: moderate,  accelerations:  Present,  decelerations:  Absent UC:   Irregular SVE:   Dilation: Fingertip Effacement (%): 80 Station: -2 Exam by:: L. lima, RN (with cytotec placement at 1340)  Labs: Lab Results  Component Value Date   WBC 13.4* 02/26/2014   HGB 12.5 02/26/2014   HCT 36.5 02/26/2014   MCV 95.1 02/26/2014   PLT 216 02/26/2014    Assessment / Plan: Induction of Labor, progressing  Labor: Induction of Labor, progressing Preeclampsia:  Labs pending Fetal Wellbeing:  Category I Pain Control:  Labor support without medications I/D:  GBS neg Anticipated MOD:  NSVD  [facility] HospitalMUHAMM[facility],Vanessa Powell 02/26/2014, 3:28 PM

## 2014-02-26 NOTE — Progress Notes (Signed)
Vanessa Powell is a 38 y.o. G3P2002 at 2632w0d by ultrasound admitted for induction of labor due to Low amniotic fluid. and Poor fetal growth.  Subjective: having only a few contractions q 10 min, and FHR cat I, no srom or bleeding.  Objective: BP 141/79  Pulse 79  Temp(Src) 97.7 F (36.5 C) (Oral)  Resp 20  Ht 5\' 5"  (1.651 m)  Wt 88.905 kg (196 lb)  BMI 32.62 kg/m2  LMP 06/12/2013  protein/cr ration 0.22 this pm    FHT:  FHR: 140-150 bpm, variability: moderate,  accelerations:  Present,  decelerations:  Absent UC:   irregular, every 10 minutes SVE:   Dilation: Fingertip Effacement (%): 80 Station: -2 Exam by:: L. lima, RN and jvf Foley bulb inserted in cervix, 60 cc instilled. Labs: Lab Results  Component Value Date   WBC 13.4* 02/26/2014   HGB 12.5 02/26/2014   HCT 36.5 02/26/2014   MCV 95.1 02/26/2014   PLT 216 02/26/2014    Assessment / Plan: IOL for iugr/ oligohydramnios Now s/p cervical ripening, now to begin foley bulb, and pitocin to be added at 8 pm.  Labor: s/p ripening only. Preeclampsia:  labs stable Fetal Wellbeing:  Category I Pain Control:  Epidural I/D:  n/a Anticipated MOD:  NSVD  Vanessa Powell V 02/26/2014, 6:28 PM BTL in am

## 2014-02-27 ENCOUNTER — Encounter (HOSPITAL_COMMUNITY): Payer: Self-pay

## 2014-02-27 ENCOUNTER — Encounter (HOSPITAL_COMMUNITY): Payer: Self-pay | Admitting: Anesthesiology

## 2014-02-27 DIAGNOSIS — O4100X0 Oligohydramnios, unspecified trimester, not applicable or unspecified: Secondary | ICD-10-CM

## 2014-02-27 DIAGNOSIS — O365990 Maternal care for other known or suspected poor fetal growth, unspecified trimester, not applicable or unspecified: Secondary | ICD-10-CM

## 2014-02-27 LAB — CBC
HEMATOCRIT: 39 % (ref 36.0–46.0)
HEMOGLOBIN: 13.3 g/dL (ref 12.0–15.0)
MCH: 32.4 pg (ref 26.0–34.0)
MCHC: 34.1 g/dL (ref 30.0–36.0)
MCV: 95.1 fL (ref 78.0–100.0)
Platelets: 210 10*3/uL (ref 150–400)
RBC: 4.1 MIL/uL (ref 3.87–5.11)
RDW: 13.9 % (ref 11.5–15.5)
WBC: 23.8 10*3/uL — AB (ref 4.0–10.5)

## 2014-02-27 LAB — GLUCOSE, CAPILLARY
Glucose-Capillary: 71 mg/dL (ref 70–99)
Glucose-Capillary: 79 mg/dL (ref 70–99)
Glucose-Capillary: 97 mg/dL (ref 70–99)

## 2014-02-27 LAB — SURGICAL PCR SCREEN
MRSA, PCR: NEGATIVE
Staphylococcus aureus: NEGATIVE

## 2014-02-27 MED ORDER — SENNOSIDES-DOCUSATE SODIUM 8.6-50 MG PO TABS
2.0000 | ORAL_TABLET | ORAL | Status: DC
  Administered 2014-02-27 – 2014-03-01 (×2): 2 via ORAL
  Filled 2014-02-27 (×2): qty 2

## 2014-02-27 MED ORDER — SODIUM CHLORIDE 0.9 % IJ SOLN
3.0000 mL | INTRAMUSCULAR | Status: DC | PRN
  Administered 2014-02-27 – 2014-02-28 (×2): 3 mL via INTRAVENOUS

## 2014-02-27 MED ORDER — SODIUM CHLORIDE 0.9 % IV SOLN: INTRAVENOUS | Status: DC

## 2014-02-27 MED ORDER — OXYCODONE-ACETAMINOPHEN 5-325 MG PO TABS: 1.0000 | ORAL_TABLET | ORAL | Status: DC | PRN

## 2014-02-27 MED ORDER — PRENATAL MULTIVITAMIN CH
1.0000 | ORAL_TABLET | Freq: Every day | ORAL | Status: DC
  Administered 2014-02-28 – 2014-03-01 (×2): 1 via ORAL
  Filled 2014-02-27 (×2): qty 1

## 2014-02-27 MED ORDER — ZOLPIDEM TARTRATE 5 MG PO TABS: 5.0000 mg | ORAL_TABLET | Freq: Every evening | ORAL | Status: DC | PRN

## 2014-02-27 MED ORDER — DIBUCAINE 1 % RE OINT
1.0000 | TOPICAL_OINTMENT | RECTAL | Status: DC | PRN
Start: 2014-02-27 — End: 2014-03-01

## 2014-02-27 MED ORDER — FAMOTIDINE 20 MG PO TABS
40.0000 mg | ORAL_TABLET | Freq: Once | ORAL | Status: AC
  Administered 2014-02-27: 40 mg via ORAL
  Filled 2014-02-27: qty 2

## 2014-02-27 MED ORDER — IBUPROFEN 600 MG PO TABS
600.0000 mg | ORAL_TABLET | Freq: Four times a day (QID) | ORAL | Status: DC
  Administered 2014-02-27 – 2014-03-01 (×10): 600 mg via ORAL
  Filled 2014-02-27 (×11): qty 1

## 2014-02-27 MED ORDER — TETANUS-DIPHTH-ACELL PERTUSSIS 5-2.5-18.5 LF-MCG/0.5 IM SUSP: 0.5000 mL | Freq: Once | INTRAMUSCULAR | Status: DC

## 2014-02-27 MED ORDER — LANOLIN HYDROUS EX OINT: 1.0000 "application " | TOPICAL_OINTMENT | CUTANEOUS | Status: DC | PRN

## 2014-02-27 MED ORDER — METOCLOPRAMIDE HCL 10 MG PO TABS
10.0000 mg | ORAL_TABLET | Freq: Once | ORAL | Status: AC
  Administered 2014-02-27: 10 mg via ORAL
  Filled 2014-02-27: qty 1

## 2014-02-27 MED ORDER — WITCH HAZEL-GLYCERIN EX PADS: 1.0000 "application " | MEDICATED_PAD | CUTANEOUS | Status: DC | PRN

## 2014-02-27 MED ORDER — BENZOCAINE-MENTHOL 20-0.5 % EX AERO: 1.0000 "application " | INHALATION_SPRAY | CUTANEOUS | Status: DC | PRN

## 2014-02-27 MED ORDER — ONDANSETRON HCL 4 MG/2ML IJ SOLN: 4.0000 mg | INTRAMUSCULAR | Status: DC | PRN

## 2014-02-27 MED ORDER — LACTATED RINGERS IV SOLN: INTRAVENOUS | Status: DC

## 2014-02-27 MED ORDER — SIMETHICONE 80 MG PO CHEW: 80.0000 mg | CHEWABLE_TABLET | ORAL | Status: DC | PRN

## 2014-02-27 MED ORDER — LACTATED RINGERS IV SOLN
INTRAVENOUS | Status: DC
  Administered 2014-02-27: 14:00:00 via INTRAVENOUS

## 2014-02-27 MED ORDER — ONDANSETRON HCL 4 MG PO TABS: 4.0000 mg | ORAL_TABLET | ORAL | Status: DC | PRN

## 2014-02-27 MED ORDER — DIPHENHYDRAMINE HCL 25 MG PO CAPS: 25.0000 mg | ORAL_CAPSULE | Freq: Four times a day (QID) | ORAL | Status: DC | PRN

## 2014-02-27 MED ORDER — LEVOTHYROXINE SODIUM 175 MCG PO TABS
175.0000 ug | ORAL_TABLET | Freq: Every day | ORAL | Status: DC
  Administered 2014-02-27 – 2014-02-28 (×2): 175 ug via ORAL
  Filled 2014-02-27 (×3): qty 1

## 2014-02-27 MED ORDER — SODIUM CHLORIDE 0.9 % IJ SOLN: 3.0000 mL | Freq: Two times a day (BID) | INTRAMUSCULAR | Status: DC

## 2014-02-27 MED ORDER — LANOLIN HYDROUS EX OINT: TOPICAL_OINTMENT | CUTANEOUS | Status: DC | PRN

## 2014-02-27 MED ORDER — SODIUM CHLORIDE 0.9 % IV SOLN: 250.0000 mL | INTRAVENOUS | Status: DC | PRN

## 2014-02-27 NOTE — Progress Notes (Signed)
Patient ID: Vanessa BoundsKimberly Powell, female   DOB: 07/31/1976, 38 y.o.   MRN: [id number] Operative Delivery Note At 2:43 AM a viable and healthy female was delivered via Vaginal, Vacuum (Extractor).  Presentation: vertex; Position: Occiput,, Anterior; Station: +3.  Verbal consent: obtained from patient.  Risks and benefits discussed in detail.  Risks include, but are not limited to the risks of anesthesia, bleeding, infection, damage to maternal tissues, fetal cephalhematoma.  There is also the risk of inability to effect vaginal delivery of the head, or shoulder dystocia that cannot be resolved by established maneuvers, leading to the need for emergency cesarean section.  APGAR: 8, 9; weight .   Placenta status: , .   Cord: 3 vessels with the following complications: .  Cord pH: not done   Anesthesia: Epidural  Instruments: Kiwi  Episiotomy: None Lacerations: 1st degree;Labial Suture Repair: none Est. Blood Loss (mL): 250  Mom to postpartum.  Baby to Nursery after couplet care , Baby to maternal abdomen initially then quickly to warmer for peds eval..  Vanessa Powell V 02/27/2014, 2:58 AM

## 2014-02-27 NOTE — Progress Notes (Signed)
Vanessa BoundsKimberly Powell is a 38 y.o. G3P2002 at 2645w1d  admitted for induction of labor due to Low amniotic fluid. and Poor fetal growth.  Subjective: Pt having variables with contractions, IUPC and FSE inserted, amnioinfusion begun. Cx stretches to 6-7/80/-1-2, vertex may be asynclytic  Objective: BP 116/58  Pulse 83  Temp(Src) 97.6 F (36.4 C) (Oral)  Resp 18  Ht 5\' 5"  (1.651 m)  Wt 88.905 kg (196 lb)  BMI 32.62 kg/m2  SpO2 98%  LMP 06/12/2013   Total I/O In: -  Out: 400 [Urine:400]  FHT:  FHR: 145 bpm, variability: moderate,  accelerations:  Present,  decelerations:  Present variable , Cat II. UC:   regular, every 3 minutes, pitocin was d/c'd at onset of variables. Still contracting regularly SVE:   Dilation: 5 Effacement (%): 80 Station: -2 Exam by:: [email or web address]  Labs: Lab Results  Component Value Date   WBC 13.9* 02/26/2014   HGB 13.3 02/26/2014   HCT 39.2 02/26/2014   MCV 95.1 02/26/2014   PLT 209 02/26/2014    Assessment / Plan: Cat II hr but improving with amnioinfusion  Labor: spontaneous at present Preeclampsia:   Fetal Wellbeing:  Category II Pain Control:  Epidural I/D:  n/a Anticipated MOD:  NSVD and if cesarean required , pt will want tubal ligation.  Vanessa Powell V 02/27/2014, 12:47 AM

## 2014-02-27 NOTE — Progress Notes (Signed)
Patient able to bend knees and lift butt of the bed slightly. Unable to get up to stedy at this time. Will wait until 5:15 to attempt again.

## 2014-02-27 NOTE — Progress Notes (Signed)
Vanessa BoundsKimberly Powell is a 38 y.o. G3P2002 at 836w1d admitted for induction of labor due to Low amniotic fluid. and Poor fetal growth.  Subjective: Pt has had slow labor, after foley bulb came out , the pt has been started on pitocin, and having mild to moderated contractions. AROM done to allow head to descend.   Objective: BP 137/86  Pulse 87  Temp(Src) 97.6 F (36.4 C) (Oral)  Resp 18  Ht 5\' 5"  (1.651 m)  Wt 88.905 kg (196 lb)  BMI 32.62 kg/m2  SpO2 98%  LMP 06/12/2013   Total I/O In: -  Out: 400 [Urine:400]  FHT:  FHR: 145 bpm, variability: moderate,  accelerations:  Present,  decelerations:  Absent UC:   regular, every 6-8 minutes, poorly documented by ext toco. Palpably mild contractions SVE:   Dilation: 5 Effacement (%): 80 Station: -2 Exam by:: Vanessa Powell  Labs: Lab Results  Component Value Date   WBC 13.9* 02/26/2014   HGB 13.3 02/26/2014   HCT 39.2 02/26/2014   MCV 95.1 02/26/2014   PLT 209 02/26/2014    Assessment / Plan: Protracted latent phase  Labor: Progressing on Pitocin, will continue to increase then AROM and arom done at midnight Preeclampsia:   Fetal Wellbeing:  Category I Pain Control:  Epidural I/D:  n/a Anticipated MOD:  NSVD  Vanessa Powell 02/27/2014, 12:04 AM

## 2014-02-27 NOTE — Progress Notes (Signed)
Dr. Emelda FearFerguson made aware of UCs and MVUs. Dr. Emelda FearFerguson said not to restart pitocin at this time.

## 2014-02-27 NOTE — Progress Notes (Signed)
Dr. Emelda FearFerguson ordered to restart pitocin at 2 units.

## 2014-02-27 NOTE — Progress Notes (Signed)
Dr. Emelda FearFerguson ordered to run another 300ml bolus amnioinusion

## 2014-02-27 NOTE — Anesthesia Postprocedure Evaluation (Signed)
Anesthesia Post Note  Patient: Vanessa Powell  Procedure(s) Performed: * No procedures listed *  Anesthesia type: Epidural  Patient location: Mother/Baby  Post pain: Pain level controlled  Post assessment: Post-op Vital signs reviewed  Last Vitals:  Filed Vitals:   02/27/14 0619  BP: 146/84  Pulse: 89  Temp: 36.9 C  Resp: 18    Post vital signs: Reviewed  Level of consciousness: awake  Complications: No apparent anesthesia complications

## 2014-02-27 NOTE — Progress Notes (Signed)
Patient scheduled for BTL today at 1500. Patient has not eaten solid food since 0600 yesterday morning. OR changed BTL from 1500 to 1630 and the again to 1745. Patient frustrated and hungry and requests to cancel BTL today. Notified MD who checked with OR about possible time. OR stated that procedure could not be done until 1800 or later. Patient given to option to wait or reschedule BTL for tomorrow. Patient requests to reschedule for tomorrow. Diet reordered and patient informed that she could eat. IV and epidural catheter left in. Earl Galasborne, Linda HedgesStefanie TrempealeauHudspeth

## 2014-02-28 ENCOUNTER — Encounter (HOSPITAL_COMMUNITY): Payer: Self-pay

## 2014-02-28 ENCOUNTER — Encounter (HOSPITAL_COMMUNITY)
Admission: RE | Disposition: A | Discharge status: Home / Self Care | Payer: Self-pay | Source: Ambulatory Visit | Attending: Obstetrics and Gynecology

## 2014-02-28 ENCOUNTER — Encounter (HOSPITAL_COMMUNITY): Payer: Managed Care, Other (non HMO) | Admitting: Anesthesiology

## 2014-02-28 ENCOUNTER — Inpatient Hospital Stay (HOSPITAL_COMMUNITY): Payer: Managed Care, Other (non HMO) | Admitting: Anesthesiology

## 2014-02-28 DIAGNOSIS — Z302 Encounter for sterilization: Secondary | ICD-10-CM

## 2014-02-28 HISTORY — PX: TUBAL LIGATION: SHX77

## 2014-02-28 LAB — GLUCOSE, CAPILLARY: Glucose-Capillary: 86 mg/dL (ref 70–99)

## 2014-02-28 SURGERY — LIGATION, FALLOPIAN TUBE, POSTPARTUM
Anesthesia: Epidural

## 2014-02-28 SURGERY — LIGATION, FALLOPIAN TUBE, POSTPARTUM
Anesthesia: Choice

## 2014-02-28 MED ORDER — LIDOCAINE-EPINEPHRINE (PF) 2 %-1:200000 IJ SOLN
INTRAMUSCULAR | Status: AC
  Filled 2014-02-28: qty 20

## 2014-02-28 MED ORDER — LACTATED RINGERS IV SOLN
INTRAVENOUS | Status: DC | PRN
  Administered 2014-02-28: 10:00:00 via INTRAVENOUS

## 2014-02-28 MED ORDER — MEPERIDINE HCL 25 MG/ML IJ SOLN: 6.2500 mg | INTRAMUSCULAR | Status: DC | PRN

## 2014-02-28 MED ORDER — MIDAZOLAM HCL 5 MG/5ML IJ SOLN
INTRAMUSCULAR | Status: DC | PRN
  Administered 2014-02-28: 2 mg via INTRAVENOUS

## 2014-02-28 MED ORDER — LIDOCAINE HCL (CARDIAC) 20 MG/ML IV SOLN
INTRAVENOUS | Status: AC
  Filled 2014-02-28: qty 5

## 2014-02-28 MED ORDER — METOCLOPRAMIDE HCL 10 MG PO TABS
10.0000 mg | ORAL_TABLET | Freq: Once | ORAL | Status: AC
  Administered 2014-02-28: 10 mg via ORAL
  Filled 2014-02-28: qty 1

## 2014-02-28 MED ORDER — SODIUM BICARBONATE 8.4 % IV SOLN
INTRAVENOUS | Status: AC
  Filled 2014-02-28: qty 50

## 2014-02-28 MED ORDER — HYDROMORPHONE HCL PF 1 MG/ML IJ SOLN: 0.2500 mg | INTRAMUSCULAR | Status: DC | PRN

## 2014-02-28 MED ORDER — EPHEDRINE SULFATE 50 MG/ML IJ SOLN
INTRAMUSCULAR | Status: DC | PRN
Start: 2014-02-28 — End: 2014-02-28
  Administered 2014-02-28: 10 mg via INTRAVENOUS

## 2014-02-28 MED ORDER — LIDOCAINE HCL (CARDIAC) 20 MG/ML IV SOLN
INTRAVENOUS | Status: DC | PRN
  Administered 2014-02-28: 20 mg via INTRAVENOUS

## 2014-02-28 MED ORDER — KETOROLAC TROMETHAMINE 30 MG/ML IJ SOLN
15.0000 mg | Freq: Once | INTRAMUSCULAR | Status: AC | PRN
Start: 2014-02-28 — End: 2014-02-28

## 2014-02-28 MED ORDER — BUPIVACAINE HCL (PF) 0.25 % IJ SOLN
INTRAMUSCULAR | Status: DC | PRN
  Administered 2014-02-28: 8 mL

## 2014-02-28 MED ORDER — PHENYLEPHRINE 40 MCG/ML (10ML) SYRINGE FOR IV PUSH (FOR BLOOD PRESSURE SUPPORT)
PREFILLED_SYRINGE | INTRAVENOUS | Status: AC
  Filled 2014-02-28: qty 5

## 2014-02-28 MED ORDER — PROMETHAZINE HCL 25 MG/ML IJ SOLN: 6.2500 mg | INTRAMUSCULAR | Status: DC | PRN

## 2014-02-28 MED ORDER — SODIUM BICARBONATE 8.4 % IV SOLN
INTRAVENOUS | Status: DC | PRN
  Administered 2014-02-28: 5 mL via EPIDURAL
  Administered 2014-02-28: 7 mL via EPIDURAL
  Administered 2014-02-28: 5 mL via EPIDURAL
  Administered 2014-02-28: 3 mL via EPIDURAL

## 2014-02-28 MED ORDER — LIDOCAINE IN DEXTROSE 5-7.5 % IV SOLN: INTRAVENOUS | Status: DC | PRN

## 2014-02-28 MED ORDER — ARTIFICIAL TEARS OP OINT
TOPICAL_OINTMENT | OPHTHALMIC | Status: AC
  Filled 2014-02-28: qty 3.5

## 2014-02-28 MED ORDER — EPHEDRINE 5 MG/ML INJ
INTRAVENOUS | Status: AC
  Filled 2014-02-28: qty 10

## 2014-02-28 MED ORDER — BUPIVACAINE HCL (PF) 0.25 % IJ SOLN
INTRAMUSCULAR | Status: AC
  Filled 2014-02-28: qty 30

## 2014-02-28 MED ORDER — MIDAZOLAM HCL 2 MG/2ML IJ SOLN
INTRAMUSCULAR | Status: AC
  Filled 2014-02-28: qty 2

## 2014-02-28 MED ORDER — FAMOTIDINE 20 MG PO TABS
40.0000 mg | ORAL_TABLET | Freq: Once | ORAL | Status: AC
  Administered 2014-02-28: 40 mg via ORAL
  Filled 2014-02-28: qty 2

## 2014-02-28 SURGICAL SUPPLY — 17 items
CLOTH BEACON ORANGE TIMEOUT ST (SAFETY) IMPLANT
DURAPREP 26ML APPLICATOR (WOUND CARE) IMPLANT
GLOVE BIO SURGEON STRL SZ7 (GLOVE) IMPLANT
GLOVE BIOGEL PI IND STRL 7.0 (GLOVE) IMPLANT
GLOVE BIOGEL PI INDICATOR 7.0 (GLOVE)
GOWN STRL REUS W/TWL LRG LVL3 (GOWN DISPOSABLE) IMPLANT
NEEDLE HYPO 22GX1.5 SAFETY (NEEDLE) IMPLANT
NS IRRIG 1000ML POUR BTL (IV SOLUTION) IMPLANT
PACK ABDOMINAL MINOR (CUSTOM PROCEDURE TRAY) IMPLANT
SPONGE LAP 4X18 X RAY DECT (DISPOSABLE) IMPLANT
SUT VIC AB 0 CT1 27 (SUTURE)
SUT VIC AB 0 CT1 27XBRD ANBCTR (SUTURE) IMPLANT
SUT VICRYL 4-0 PS2 18IN ABS (SUTURE) IMPLANT
SYR CONTROL 10ML LL (SYRINGE) IMPLANT
TOWEL OR 17X24 6PK STRL BLUE (TOWEL DISPOSABLE) IMPLANT
TRAY FOLEY CATH 14FR (SET/KITS/TRAYS/PACK) IMPLANT
WATER STERILE IRR 1000ML POUR (IV SOLUTION) IMPLANT

## 2014-02-28 SURGICAL SUPPLY — 19 items
BNDG COHESIVE 4X5 WHT NS (GAUZE/BANDAGES/DRESSINGS) ×3 IMPLANT
CHLORAPREP W/TINT 26ML (MISCELLANEOUS) ×3 IMPLANT
CLIP FILSHIE TUBAL LIGA STRL (Clip) ×3 IMPLANT
CLOTH BEACON ORANGE TIMEOUT ST (SAFETY) ×3 IMPLANT
GLOVE BIOGEL PI IND STRL 7.0 (GLOVE) ×1 IMPLANT
GLOVE BIOGEL PI INDICATOR 7.0 (GLOVE) ×2
GLOVE ECLIPSE 7.0 STRL STRAW (GLOVE) ×6 IMPLANT
GOWN STRL REUS W/TWL LRG LVL3 (GOWN DISPOSABLE) ×6 IMPLANT
NEEDLE HYPO 22GX1.5 SAFETY (NEEDLE) ×3 IMPLANT
NS IRRIG 1000ML POUR BTL (IV SOLUTION) ×3 IMPLANT
PACK ABDOMINAL MINOR (CUSTOM PROCEDURE TRAY) ×3 IMPLANT
SPONGE LAP 4X18 X RAY DECT (DISPOSABLE) IMPLANT
SUT VIC AB 0 CT1 27 (SUTURE) ×2
SUT VIC AB 0 CT1 27XBRD ANBCTR (SUTURE) ×1 IMPLANT
SUT VICRYL 4-0 PS2 18IN ABS (SUTURE) ×3 IMPLANT
SYR CONTROL 10ML LL (SYRINGE) ×3 IMPLANT
TOWEL OR 17X24 6PK STRL BLUE (TOWEL DISPOSABLE) ×6 IMPLANT
TRAY FOLEY CATH 14FR (SET/KITS/TRAYS/PACK) ×3 IMPLANT
WATER STERILE IRR 1000ML POUR (IV SOLUTION) ×3 IMPLANT

## 2014-02-28 NOTE — Interval H&P Note (Signed)
History and Physical Interval Note:  02/28/2014 10:08 AM  Vanessa Powell  has presented today for surgery, with the diagnosis of Desires Sterilization  The various methods of treatment have been discussed with the patient and family. After consideration of risks, benefits and other options for treatment, the patient has consented to  Procedure(s): POST PARTUM TUBAL LIGATION (N/A) as a surgical intervention .  The patient's history has been reviewed, patient examined, no change in status, stable for surgery.  I have reviewed the patient's chart and labs.  Questions were answered to the patient's satisfaction.   Patient counseled, r.e. Risks benefits of BTL, including permanency of procedure, risk of failure(1:100), increased risk of ectopic.  Patient verbalized understanding and desires to proceed   [name] S

## 2014-02-28 NOTE — Progress Notes (Signed)
Post Partum Day 1 Subjective: up ad lib, voiding, tolerating PO, + flatus. Pt was frustrated about having her BTL postponed several times yesterday, and asked to have it rescheduled until today.  Objective: Blood pressure 131/86, pulse 83, temperature 97.8 F (36.6 C), temperature source Oral, resp. rate 18, height 5\' 5"  (1.651 m), weight 88.905 kg (196 lb), last menstrual period 06/12/2013, SpO2 98.00%, unknown if currently breastfeeding.  Physical Exam:  General: alert and cooperative Lochia: appropriate Uterine Fundus: firm Incision: healing well, no significant drainage, no dehiscence, no significant erythema DVT Evaluation: No evidence of DVT seen on physical exam.   Recent Labs  02/26/14 2025 02/27/14 0705  HGB 13.3 13.3  HCT 39.2 39.0    Assessment/Plan: Plan for discharge tomorrow s/p results of BTL.   LOS: 2 days   Unknown FoleyWinkel, Deija Buhrman T 02/28/2014, 9:13 AM

## 2014-02-28 NOTE — Addendum Note (Signed)
Addendum created 02/28/14 1531 by Renford DillsJanet L Izzak Fries, CRNA   Modules edited: Notes Section   Notes Section:  File: [id number]

## 2014-02-28 NOTE — Anesthesia Preprocedure Evaluation (Addendum)
Anesthesia Evaluation  Patient identified by MRN, date of birth, ID band Patient awake    Reviewed: Allergy & Precautions, H&P , NPO status , Patient's Chart, lab work & pertinent test results  Airway Mallampati: II TM Distance: >3 FB Neck ROM: Full    Dental no notable dental hx.    Pulmonary Current Smoker,  breath sounds clear to auscultation  Pulmonary exam normal       Cardiovascular negative cardio ROS  Rhythm:Regular Rate:Normal     Neuro/Psych negative neurological ROS  negative psych ROS   GI/Hepatic negative GI ROS, Neg liver ROS,   Endo/Other  diabetes, GestationalHypothyroidism   Renal/GU negative Renal ROS  negative genitourinary   Musculoskeletal negative musculoskeletal ROS (+)   Abdominal   Peds negative pediatric ROS (+)  Hematology negative hematology ROS (+)   Anesthesia Other Findings   Reproductive/Obstetrics negative OB ROS                          Anesthesia Physical  Anesthesia Plan  ASA: II  Anesthesia Plan: Epidural   Post-op Pain Management:    Induction:   Airway Management Planned:   Additional Equipment:   Intra-op Plan:   Post-operative Plan:   Informed Consent: I have reviewed the patients History and Physical, chart, labs and discussed the procedure including the risks, benefits and alternatives for the proposed anesthesia with the patient or authorized representative who has indicated his/her understanding and acceptance.     Plan Discussed with: Surgeon  Anesthesia Plan Comments: (For PPTL epidural is in-situ)       Anesthesia Quick Evaluation

## 2014-02-28 NOTE — Anesthesia Postprocedure Evaluation (Signed)
  Anesthesia Post-op Note  Patient: Vanessa Powell  Procedure(s) Performed: Procedure(s): POST PARTUM TUBAL LIGATION (N/A)  Patient Location: Mother/Baby  Anesthesia Type:Epidural  Level of Consciousness: awake  Airway and Oxygen Therapy: Patient Spontanous Breathing  Post-op Pain: mild  Post-op Assessment: Patient's Cardiovascular Status Stable and Respiratory Function Stable  Post-op Vital Signs: stable  Complications: No apparent anesthesia complications

## 2014-02-28 NOTE — Anesthesia Postprocedure Evaluation (Deleted)
Anesthesia Post Note  Patient: Vanessa BoundsKimberly Powell  Procedure(s) Performed: Procedure(s) (LRB): POST PARTUM TUBAL LIGATION (N/A)  Anesthesia type: General  Patient location: PACU  Post pain: Pain level controlled  Post assessment: Post-op Vital signs reviewed  Last Vitals:  Filed Vitals:   02/28/14 1205  BP: 103/56  Pulse: 70  Temp:   Resp: 17    Post vital signs: Reviewed  Level of consciousness: sedated  Complications: No apparent anesthesia complications

## 2014-02-28 NOTE — Anesthesia Postprocedure Evaluation (Signed)
Anesthesia Post Note  Patient: Vanessa Powell  Procedure(s) Performed: Procedure(s) (LRB): POST PARTUM TUBAL LIGATION (N/A)  Anesthesia type: Epidural  Patient location: PACU  Post pain: Pain level controlled  Post assessment: Post-op Vital signs reviewed  Last Vitals:  Filed Vitals:   02/28/14 1205  BP: 103/56  Pulse: 70  Temp:   Resp: 17    Post vital signs: Reviewed  Level of consciousness: awake  Complications: No apparent anesthesia complications

## 2014-02-28 NOTE — Op Note (Signed)
Vanessa BradfordKimberly Powell  02/26/2014 - 02/28/2014  PREOPERATIVE DIAGNOSIS:  Multiparity, undesired fertility  POSTOPERATIVE DIAGNOSIS:  Multiparity, undesired fertility  PROCEDURE:  Postpartum Bilateral Tubal Sterilization using Filshie Clips   ANESTHESIA:  Epidural and local analgesia using 0.25% Marcaine  COMPLICATIONS:  None immediate.  ESTIMATED BLOOD LOSS: 5 ml.  INDICATIONS: 38 y.o. [id number] with undesired fertility,status post vaginal delivery, desires permanent sterilization.  Other reversible forms of contraception were discussed with patient; she declines all other modalities. Risks of procedure discussed with patient including but not limited to: risk of regret, permanence of method, bleeding, infection, injury to surrounding organs and need for additional procedures.  Failure risk of 0.5-1% with increased risk of ectopic gestation if pregnancy occurs was also discussed with patient.     FINDINGS:  Normal uterus, tubes, and ovaries.  PROCEDURE DETAILS: The patient was taken to the operating room where her spinal anesthesia was dosed up to surgical level and found to be adequate.  She was then placed in a supine position and prepped and draped in the usual sterile fashion.  After an adequate timeout was performed, attention was turned to the patient's abdomen where a small transverse skin incision was made under the umbilical fold. The incision was taken down to the layer of fascia using the scalpel, and fascia was incised, and extended bilaterally. The peritoneum was entered in a sharp fashion. The patient was placed in Trendelenburg.  A moist lap pad was used to move omentum and bowel away until the left fallopian tube was identified and grasped with a Babcock clamp, and followed out to the fimbriated end.  A Filshie clip was placed on the left fallopian tube about 2 cm from the cornu.  A similar process was carried out on the right side allowing for bilateral tubal sterilization.  Good  hemostasis was noted overall.  Local analgesia was drizzled onto both Filshie application sites.The instruments were then removed from the patient's abdomen and the fascial incision was repaired with 0 Vicryl, and the skin was closed with a 4-0 Vicryl subcuticular stitch. The patient tolerated the procedure well.  Sponge, lap, and needle counts were correct times two.  The patient was then taken to the recovery room awake, extubated and in stable condition.  Nadia Torr S MD 02/28/2014 10:55 AM

## 2014-02-28 NOTE — Progress Notes (Signed)
Pt. Seen and examined.  Agree with above

## 2014-02-28 NOTE — Transfer of Care (Signed)
Immediate Anesthesia Transfer of Care Note  Patient: Vanessa Powell  Procedure(s) Performed: Procedure(s): POST PARTUM TUBAL LIGATION (N/A)  Patient Location: PACU  Anesthesia Type:Epidural  Level of Consciousness: alert and cooperative  Airway & Oxygen Therapy: Patient Spontanous Breathing  Post-op Assessment: Report given to PACU RN and Post -op Vital signs reviewed and stable  Post vital signs: Reviewed and stable  Complications: No apparent anesthesia complications

## 2014-03-01 ENCOUNTER — Encounter: Payer: Self-pay | Admitting: *Deleted

## 2014-03-01 ENCOUNTER — Encounter (HOSPITAL_COMMUNITY): Payer: Self-pay | Admitting: Family Medicine

## 2014-03-01 DIAGNOSIS — Z8759 Personal history of other complications of pregnancy, childbirth and the puerperium: Secondary | ICD-10-CM

## 2014-03-01 MED ORDER — LEVOTHYROXINE SODIUM 100 MCG PO TABS
100.0000 ug | ORAL_TABLET | Freq: Every day | ORAL | Status: DC
  Administered 2014-03-01: 100 ug via ORAL
  Filled 2014-03-01: qty 1

## 2014-03-01 MED ORDER — IBUPROFEN 600 MG PO TABS: 600.0000 mg | ORAL_TABLET | Freq: Four times a day (QID) | ORAL | Status: DC

## 2014-03-01 MED ORDER — LEVOTHYROXINE SODIUM 100 MCG PO TABS: 100.0000 ug | ORAL_TABLET | Freq: Every day | ORAL | Status: DC

## 2014-03-01 NOTE — Discharge Summary (Signed)
Obstetric Discharge Summary Reason for Admission: induction of labor and SGA Prenatal Procedures: none Intrapartum Procedures: vacuum Postpartum Procedures: P.P. tubal ligation Complications-Operative and Postpartum: none Hemoglobin  Date Value Ref Range Status  02/27/2014 13.3  12.0 - 15.0 g/dL Final     HCT  Date Value Ref Range Status  02/27/2014 39.0  36.0 - 46.0 % Final    Discharge Diagnoses: Term Pregnancy-delivered  Hospital Course:  Vanessa Powell is a 38 y.o. [phone] who presented with IOL for SGA infant 3rd percentile with good doppler flow RI of 0.59, and 0.54 with BPP 8/8 on Thursday, 3/19. Marland Kitchen.  She had a vacuum assisted VD and received PP BTL on PPD#1. She was able to ambulate, tolerate PO and void normally. She was discharged home with instructions for postpartum care.    Delivery Note At 2:43 AM a healthy female was delivered via Vaginal, Vacuum (Extractor) (Presentation: OA  ).  APGAR: 8, 9; weight 5 lb 2.8 oz (2347 g).   Placenta status: Intact, Spontaneous.  Cord: 3 vessels with the following complications: None.  Cord pH: na  Anesthesia: Epidural  Episiotomy: None Lacerations: 1st degree;Labial Suture Repair: na. Est. Blood Loss (mL): 250  Mom to postpartum.  Baby to Couplet care / Skin to Skin.  Physical Exam:  General: alert, cooperative and mildly obese Lochia: appropriate Uterine Fundus: firm DVT Evaluation: No evidence of DVT seen on physical exam. Negative Homan's sign. No cords or calf tenderness. No significant calf/ankle edema.  Discharge Information: Date: 03/01/2014 Activity: pelvic rest Diet: routine Medications: PNV and Ibuprofen Baby feeding: plans to bottle feed Contraception: bilateral tubal ligation Condition: stable Instructions: refer to practice specific booklet Discharge to: home   Newborn Data: Live born female  Birth Weight: 5 lb 2.8 oz (2347 g) APGAR: 8, 9  Home with mother.  Wenda LowJames Joyner, MD [facility] PGY-1 03/01/2014,  7:18 AM  I was present for the exam and agree with above. [facility]   CharentonVirginia Mainor Powell, CNM 03/01/2014 10:40 AM

## 2014-03-01 NOTE — Discharge Instructions (Signed)
Postpartum Care After Vaginal Delivery °After you deliver your newborn (postpartum period), the usual stay in the hospital is 24 72 hours. If there were problems with your labor or delivery, or if you have other medical problems, you might be in the hospital longer.  °While you are in the hospital, you will receive help and instructions on how to care for yourself and your newborn during the postpartum period.  °While you are in the hospital: °· Be sure to tell your nurses if you have pain or discomfort, as well as where you feel the pain and what makes the pain worse. °· If you had an incision made near your vagina (episiotomy) or if you had some tearing during delivery, the nurses may put ice packs on your episiotomy or tear. The ice packs may help to reduce the pain and swelling. °· If you are breastfeeding, you may feel uncomfortable contractions of your uterus for a couple of weeks. This is normal. The contractions help your uterus get back to normal size. °· It is normal to have some bleeding after delivery. °· For the first 1 3 days after delivery, the flow is red and the amount may be similar to a period. °· It is common for the flow to start and stop. °· In the first few days, you may pass some small clots. Let your nurses know if you begin to pass large clots or your flow increases. °· Do not  flush blood clots down the toilet before having the nurse look at them. °· During the next 3 10 days after delivery, your flow should become more watery and pink or brown-tinged in color. °· Ten to fourteen days after delivery, your flow should be a small amount of yellowish-white discharge. °· The amount of your flow will decrease over the first few weeks after delivery. Your flow may stop in 6 8 weeks. Most women have had their flow stop by 12 weeks after delivery. °· You should change your sanitary pads frequently. °· Wash your hands thoroughly with soap and water for at least 20 seconds after changing pads, using  the toilet, or before holding or feeding your newborn. °· You should feel like you need to empty your bladder within the first 6 8 hours after delivery. °· In case you become weak, lightheaded, or faint, call your nurse before you get out of bed for the first time and before you take a shower for the first time. °· Within the first few days after delivery, your breasts may begin to feel tender and full. This is called engorgement. Breast tenderness usually goes away within 48 72 hours after engorgement occurs. You may also notice milk leaking from your breasts. If you are not breastfeeding, do not stimulate your breasts. Breast stimulation can make your breasts produce more milk. °· Spending as much time as possible with your newborn is very important. During this time, you and your newborn can feel close and get to know each other. Having your newborn stay in your room (rooming in) will help to strengthen the bond with your newborn.  It will give you time to get to know your newborn and become comfortable caring for your newborn. °· Your hormones change after delivery. Sometimes the hormone changes can temporarily cause you to feel sad or tearful. These feelings should not last more than a few days. If these feelings last longer than that, you should talk to your caregiver. °· If desired, talk to your caregiver about   methods of family planning or contraception. °· Talk to your caregiver about immunizations. Your caregiver may want you to have the following immunizations before leaving the hospital: °· Tetanus, diphtheria, and pertussis (Tdap) or tetanus and diphtheria (Td) immunization. It is very important that you and your family (including grandparents) or others caring for your newborn are up-to-date with the Tdap or Td immunizations. The Tdap or Td immunization can help protect your newborn from getting ill. °· Rubella immunization. °· Varicella (chickenpox) immunization. °· Influenza immunization. You should  receive this annual immunization if you did not receive the immunization during your pregnancy. °Document Released: 09/21/2007 Document Revised: 08/18/2012 Document Reviewed: 07/21/2012 °ExitCare® Patient Information ©2014 [employer]. ° °Postpartum Tubal Ligation °A postpartum tubal ligation (PPTL) is a procedure that blocks the fallopian tubes right after childbirth or 1 2 days after childbirth. PPTL is done before the uterus returns to its normal location. The procedure is also called a minilaparotomy. By blocking the fallopian tubes, the eggs that are released from the ovaries cannot enter the uterus and sperm cannot reach the egg. A PPTL is done so you will not be able to get pregnant or have a baby.  °Although this procedure may be reversed, it should be considered permanent and irreversible. If you want to have future pregnancies, you should not have this procedure. °LET YOUR CAREGIVER KNOW ABOUT: °· Allergies to food or medicine. °· Medicines taken, including vitamins, herbs, eyedrops, over-the-counter medicines, and creams. °· Use of steroids (by mouth or creams). °· Previous problems with numbing medicines. °· History of bleeding problems or blood clots. °· Any recent colds or infections. °· Previous surgery. °· Other health problems, including diabetes and kidney problems. °RISKS AND COMPLICATIONS °· Infection. °· Bleeding. °· Injury to other organs. °· Anesthetic side effects. °· Failure of the procedure. °· Ectopic pregnancy. °· Future regret about having the procedure done. °BEFORE THE PROCEDURE  °· You may need to sign certain permission forms with your insurance up to 30 days before your due date. °· After delivering your baby, you cannot eat or drink anything if the procedure is performed the same day. If you are having the procedure a day after delivering, you may be able to eat and drink until midnight. Your caregiver will give you specific directions depending on your situation. °PROCEDURE   °· If done 1 2 days after delivery: °· You will be given a medicine to make you sleep (general anesthetic) during the procedure. °· A tube will be put down your throat to help your breath while under general anesthesia. °· A small cut (incision) is made just beneath the belly button. °· The fallopian tubes are brought up through the incision. °· The fallopian tubes are then sealed, tied, or cut. °· If done after a caesarean delivery: °· Tubal ligation is done through the incision already made (after the baby is delivered). °· Once the tubes are blocked, the incision is closed with stitches (sutures). A bandage will be placed over the incisions. °AFTER THE PROCEDURE °· You may have some pain or cramps in the abdominal area for the next 3 7 days. °· You will be given pain medicine to ease any discomfort. °· You may also feel sick to your stomach if you were given a general anesthetic. °· You may have some mild discomfort in the throat. This is from the tube that may have been placed in your throat while you were sleeping. °· You may feel tired and should rest   the remainder of the day. °Document Released: 11/24/2005 Document Revised: 05/25/2012 Document Reviewed: 03/06/2012 °ExitCare® Patient Information ©2014 [employer]. ° °

## 2014-03-21 ENCOUNTER — Telehealth: Payer: Self-pay | Admitting: *Deleted

## 2014-03-21 ENCOUNTER — Encounter: Payer: Self-pay | Admitting: Advanced Practice Midwife

## 2014-03-21 ENCOUNTER — Ambulatory Visit (INDEPENDENT_AMBULATORY_CARE_PROVIDER_SITE_OTHER): Payer: Managed Care, Other (non HMO) | Admitting: Advanced Practice Midwife

## 2014-03-21 VITALS — BP 146/88 | Ht 67.0 in | Wt 183.0 lb

## 2014-03-21 DIAGNOSIS — E079 Disorder of thyroid, unspecified: Secondary | ICD-10-CM

## 2014-03-21 DIAGNOSIS — O99285 Endocrine, nutritional and metabolic diseases complicating the puerperium: Secondary | ICD-10-CM

## 2014-03-21 DIAGNOSIS — E039 Hypothyroidism, unspecified: Secondary | ICD-10-CM

## 2014-03-21 DIAGNOSIS — O99815 Abnormal glucose complicating the puerperium: Secondary | ICD-10-CM

## 2014-03-21 NOTE — Telephone Encounter (Signed)
[facility]/2014 prior to delivery pt was taken synthroid and metformin but has not been taken since [facility]. Pt has not been checking her blood sugars. Appointment made for pt to see provider to discuss.

## 2014-03-21 NOTE — Progress Notes (Signed)
Vanessa BoundsKimberly Powell 38 y.o.  Filed Vitals:   03/21/14 1408  BP: 146/88   Past Medical History  Diagnosis Date  . Abscess   . Thyroid disease   . Gestational diabetes mellitus, antepartum   . Hypothyroidism    Past Surgical History  Procedure Laterality Date  . Ankle surgery    . Cholecystectomy    . Tubal ligation N/A 02/28/2014    Procedure: POST PARTUM TUBAL LIGATION;  Surgeon: Reva Boresanya S Pratt, MD;  Location: [facility];  Service: Gynecology;  Laterality: N/A;   family history includes Cancer in her maternal grandfather, other, and paternal grandmother; Diabetes in her mother; Hypertension in her maternal grandfather and mother; Kidney disease in her other; Thyroid disease in her maternal grandmother. No current outpatient prescriptions on file.  HPI:  SVD 02/27/14.  Was A2DM on metformin.  Diagnosed with hypothyroidism at first PNV, ended up on 175mcg of Synthroid. No elevated b/p in pregnancy/intrapartum; Stopped both meds after delivery.  Feels great   ASSESSMENT:  3 weeks pp: borderline B/P.  Hypothyroid, no meds for 3 weeks  PLAN:  TSH today; 2hr gtt at 6 week checkup

## 2014-03-22 ENCOUNTER — Other Ambulatory Visit: Payer: Self-pay | Admitting: Advanced Practice Midwife

## 2014-03-22 ENCOUNTER — Encounter: Payer: Self-pay | Admitting: Advanced Practice Midwife

## 2014-03-22 LAB — TSH: TSH: 48.937 u[IU]/mL — AB (ref 0.350–4.500)

## 2014-03-22 MED ORDER — LEVOTHYROXINE SODIUM 100 MCG PO TABS: 100.0000 ug | ORAL_TABLET | Freq: Every day | ORAL | Status: DC

## 2014-03-22 NOTE — Progress Notes (Unsigned)
TSH 48.  No meds for 3 weeks.  Had been on 175mcg synthroid.  Will restart at 100mcg and check TSH 4 weeks.  Jacklyn ShellFrances Cresenzo-Dishmon

## 2014-04-06 ENCOUNTER — Encounter: Payer: Self-pay | Admitting: Advanced Practice Midwife

## 2014-04-06 ENCOUNTER — Other Ambulatory Visit: Payer: Managed Care, Other (non HMO)

## 2014-04-06 ENCOUNTER — Ambulatory Visit (INDEPENDENT_AMBULATORY_CARE_PROVIDER_SITE_OTHER): Payer: Managed Care, Other (non HMO) | Admitting: Advanced Practice Midwife

## 2014-04-06 DIAGNOSIS — O99810 Abnormal glucose complicating pregnancy: Secondary | ICD-10-CM

## 2014-04-06 NOTE — Progress Notes (Signed)
  Vanessa BoundsKimberly Powell is a 38 y.o. who presents for a postpartum visit. She is 6 weeks postpartum following a spontaneous vaginal delivery. I have fully reviewed the prenatal and intrapartum course.She was induced at 37.1 gestational weeks for IUGR.Anesthesia: epidural. Postpartum course has been uneventful. Baby's course has been uneventful. Baby is feeding by bottle. Bleeding: no bleeding. Bowel function is normal. Bladder function is normal. Patient is sexually active. Contraception method is tubal ligation, which was done immediately postpartum. Postpartum depression screening: negative. Doing gtt today.  Restarted synthroid.  Sees Dr Vanessa Powell as PCP  Review of Systems   Constitutional: Negative for fever and chills Eyes: Negative for visual disturbances Respiratory: Negative for shortness of breath, dyspnea Cardiovascular: Negative for chest pain or palpitations  Gastrointestinal: Negative for vomiting, diarrhea and constipation Genitourinary: Negative for dysuria and urgency Musculoskeletal: Negative for back pain, joint pain, myalgias  Neurological: Negative for dizziness and headaches   Objective:     Filed Vitals:   04/06/14 0944  BP: 138/86   General:  alert, cooperative and no distress   Breasts:  negative  Lungs: clear to auscultation bilaterally  Heart:  regular rate and rhythm  Abdomen: Soft, nontender   Vulva:  normal  Vagina: normal vagina  Cervix:  closed  Corpus: Well involuted     Rectal Exam: no hemorrhoids        Assessment:    normal postpartum exam.  Plan:    1. Contraception: tubal ligation 2. Follow up in: or as needed.

## 2014-04-06 NOTE — Addendum Note (Signed)
Addended by: Criss AlvinePULLIAM, Khamauri Bauernfeind G on: 04/06/2014 09:08 AM   Modules accepted: Orders

## 2014-04-07 LAB — GLUCOSE TOLERANCE, 2 HOURS W/ 1HR
GLUCOSE, FASTING: 78 mg/dL (ref 70–99)
Glucose, 1 hour: 108 mg/dL (ref 70–170)
Glucose, 2 hour: 76 mg/dL (ref 70–139)

## 2014-04-10 ENCOUNTER — Telehealth: Payer: Self-pay | Admitting: *Deleted

## 2014-04-10 NOTE — Telephone Encounter (Signed)
Requesting note to return to work with no restrictions. Note faxed to [phone].

## 2014-04-11 ENCOUNTER — Encounter: Payer: Self-pay | Admitting: Advanced Practice Midwife

## 2014-04-16 NOTE — Progress Notes (Signed)
Reactive NST  SSE no pool no nitrazine no fern No evidence of ROM

## 2014-10-09 ENCOUNTER — Encounter: Payer: Self-pay | Admitting: Advanced Practice Midwife

## 2014-12-21 ENCOUNTER — Encounter (HOSPITAL_COMMUNITY): Payer: Self-pay | Admitting: Obstetrics & Gynecology

## 2015-05-17 ENCOUNTER — Encounter (HOSPITAL_COMMUNITY): Payer: Self-pay | Admitting: Obstetrics & Gynecology

## 2017-05-08 ENCOUNTER — Emergency Department (HOSPITAL_COMMUNITY)
Admission: EM | Admit: 2017-05-08 | Discharge: 2017-05-08 | Disposition: A | Discharge status: Home / Self Care | Payer: Managed Care, Other (non HMO) | Attending: Emergency Medicine | Admitting: Emergency Medicine

## 2017-05-08 ENCOUNTER — Encounter (HOSPITAL_COMMUNITY): Payer: Self-pay

## 2017-05-08 DIAGNOSIS — E039 Hypothyroidism, unspecified: Secondary | ICD-10-CM | POA: Insufficient documentation

## 2017-05-08 DIAGNOSIS — F17210 Nicotine dependence, cigarettes, uncomplicated: Secondary | ICD-10-CM | POA: Insufficient documentation

## 2017-05-08 DIAGNOSIS — Z79899 Other long term (current) drug therapy: Secondary | ICD-10-CM | POA: Insufficient documentation

## 2017-05-08 DIAGNOSIS — J40 Bronchitis, not specified as acute or chronic: Secondary | ICD-10-CM

## 2017-05-08 MED ORDER — ALBUTEROL SULFATE HFA 108 (90 BASE) MCG/ACT IN AERS
2.0000 | INHALATION_SPRAY | Freq: Once | RESPIRATORY_TRACT | Status: AC
  Administered 2017-05-08: 2 via RESPIRATORY_TRACT
  Filled 2017-05-08: qty 6.7

## 2017-05-08 MED ORDER — PREDNISONE 20 MG PO TABS: 40.0000 mg | ORAL_TABLET | Freq: Every day | ORAL | 0 refills | Status: DC

## 2017-05-08 MED ORDER — BENZONATATE 100 MG PO CAPS
200.0000 mg | ORAL_CAPSULE | Freq: Once | ORAL | Status: AC
  Administered 2017-05-08: 200 mg via ORAL
  Filled 2017-05-08: qty 2

## 2017-05-08 MED ORDER — BENZONATATE 200 MG PO CAPS: 200.0000 mg | ORAL_CAPSULE | Freq: Three times a day (TID) | ORAL | 0 refills | Status: DC | PRN

## 2017-05-08 NOTE — ED Provider Notes (Signed)
[facility] Provider Note   CSN: [id number] Arrival date & time: 05/08/17  1624     History   Chief Complaint Chief Complaint  Patient presents with  . Cough  . Nasal Congestion    HPI Vanessa SaneKimberly Krajewski is a 41 y.o. female.  HPI  Vanessa Powell is a 41 y.o. female who presents to the Emergency Department complaining of cough, nasal congestion and sinus pressure.  Cough has been occasionally productive.  Symptoms have been present for one week.  She has tried OTC cold and cough medications without relief.  She denies fever, shortness of breath, chest pain or tightness, sore throat.     Past Medical History:  Diagnosis Date  . Abscess   . Gestational diabetes mellitus, antepartum   . Hypothyroidism   . Thyroid disease     Patient Active Problem List   Diagnosis Date Noted  . Abnormal maternal glucose tolerance, antepartum 02/09/2014  . 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 Smoker 10/24/2013  . Hypothyroid 10/21/2013    Past Surgical History:  Procedure Laterality Date  . ANKLE SURGERY    . CHOLECYSTECTOMY    . TUBAL LIGATION N/A 02/28/2014   Procedure: POST PARTUM TUBAL LIGATION;  Surgeon: Reva Boresanya S Pratt, MD;  Location: [facility];  Service: Gynecology;  Laterality: N/A;    OB History    Gravida Para Term Preterm AB Living   4 3 3     3    SAB TAB Ectopic Multiple Live Births           3       Home Medications    Prior to Admission medications   Medication Sig Start Date End Date Taking? Authorizing Provider  benzonatate (TESSALON) 200 MG capsule Take 1 capsule (200 mg total) by mouth 3 (three) times daily as needed for cough. Swallow whole, do not chew 05/08/17   Saket Hellstrom, PA-C  levothyroxine (SYNTHROID) 100 MCG tablet Take 1 tablet (100 mcg total) by mouth daily before breakfast. 03/22/14   Cresenzo-Dishmon, Scarlette CalicoFrances, CNM  omeprazole (PRILOSEC) 20 MG capsule  03/28/14   [provider]  predniSONE (DELTASONE) 20 MG tablet Take 2 tablets (40 mg  total) by mouth daily. 05/08/17   Pauline Ausriplett, Zoraya Fiorenza, PA-C    Family History Family History  Problem Relation Age of Onset  . Diabetes Mother   . Hypertension Mother   . Thyroid disease Maternal Grandmother   . Cancer Maternal Grandfather        lung  . Hypertension Maternal Grandfather   . Cancer Other        lung  . Kidney disease Other   . Cancer Paternal Grandmother     Social History Social History  Substance Use Topics  . Smoking status: Current Every Day Smoker    Packs/day: 0.50    Types: Cigarettes  . Smokeless tobacco: Never Used  . Alcohol use No     Allergies   Sulfur   Review of Systems Review of Systems  Constitutional: Negative for activity change, appetite change, chills and fever.  HENT: Positive for congestion, rhinorrhea, sinus pressure and sore throat. Negative for facial swelling and trouble swallowing.   Eyes: Negative for visual disturbance.  Respiratory: Positive for cough. Negative for shortness of breath, wheezing and stridor.   Gastrointestinal: Negative for nausea and vomiting.  Musculoskeletal: Negative for neck pain and neck stiffness.  Neurological: Negative for dizziness, weakness, numbness and headaches.  Hematological: Negative for adenopathy.  Psychiatric/Behavioral: Negative for confusion.  All other  systems reviewed and are negative.    Physical Exam Updated Vital Signs BP (!) 141/92 (BP Location: Left Arm)   Pulse 88   Temp 97.9 F (36.6 C) (Oral)   Resp 18   Ht 5\' 7"  (1.702 m)   Wt 81.6 kg (180 lb)   LMP 04/13/2017   SpO2 98%   BMI 28.19 kg/m   Physical Exam  Constitutional: She is oriented to person, place, and time. She appears well-developed and well-nourished. No distress.  HENT:  Head: Normocephalic and atraumatic.  Right Ear: Tympanic membrane and ear canal normal.  Left Ear: Tympanic membrane and ear canal normal.  Nose: Mucosal edema present. No rhinorrhea.  Mouth/Throat: Uvula is midline and mucous  membranes are normal. No trismus in the jaw. No uvula swelling. Posterior oropharyngeal erythema present. No oropharyngeal exudate, posterior oropharyngeal edema or tonsillar abscesses.  Eyes: Conjunctivae are normal.  Neck: Normal range of motion and phonation normal. Neck supple. No Brudzinski's sign and no Kernig's sign noted.  Cardiovascular: Normal rate, regular rhythm, normal heart sounds and intact distal pulses.   No murmur heard. Pulmonary/Chest: Effort normal and breath sounds normal. No respiratory distress. She has no wheezes. She has no rales.  Coarse lung sounds bilaterally.  No rales or wheezes  Abdominal: Soft. She exhibits no distension. There is no tenderness. There is no rebound and no guarding.  Musculoskeletal: Normal range of motion. She exhibits no edema.  Lymphadenopathy:    She has no cervical adenopathy.  Neurological: She is alert and oriented to person, place, and time. She exhibits normal muscle tone. Coordination normal.  Skin: Skin is warm and dry.  Nursing note and vitals reviewed.    ED Treatments / Results  Labs (all labs ordered are listed, but only abnormal results are displayed) Labs Reviewed - No data to display  EKG  EKG Interpretation None       Radiology No results found.  Procedures Procedures (including critical care time)  Medications Ordered in ED Medications  albuterol (PROVENTIL HFA;VENTOLIN HFA) 108 (90 Base) MCG/ACT inhaler 2 puff (2 puffs Inhalation Given 05/08/17 1800)  benzonatate (TESSALON) capsule 200 mg (200 mg Oral Given 05/08/17 1810)     Initial Impression / Assessment and Plan / ED Course  I have reviewed the triage vital signs and the nursing notes.  Pertinent labs & imaging results that were available during my care of the patient were reviewed by me and considered in my medical decision making (see chart for details).     Vitals stable.  No hypoxia or tachypnea.  Albuterol MDI dispensed.  Sx's likely viral.  Rx  for steroid and tessalon.    Final Clinical Impressions(s) / ED Diagnoses   Final diagnoses:  Bronchitis    New Prescriptions New Prescriptions   BENZONATATE (TESSALON) 200 MG CAPSULE    Take 1 capsule (200 mg total) by mouth 3 (three) times daily as needed for cough. Swallow whole, do not chew   PREDNISONE (DELTASONE) 20 MG TABLET    Take 2 tablets (40 mg total) by mouth daily.     Pauline Aus, PA-C 05/09/17 1330    Eber Hong, MD 05/10/17 [phone]

## 2017-05-08 NOTE — Discharge Instructions (Signed)
Drink plenty of fluids.  1-2 puffs of the inhaler every 4-6 hrs as needed.  Follow-up with your doctor for recheck

## 2017-05-08 NOTE — ED Triage Notes (Signed)
Nasal congestion and cough x1 week. Denies fevers.

## 2017-08-15 ENCOUNTER — Emergency Department (HOSPITAL_COMMUNITY)
Admission: EM | Admit: 2017-08-15 | Discharge: 2017-08-15 | Disposition: A | Discharge status: Home / Self Care | Payer: PRIVATE HEALTH INSURANCE | Attending: Emergency Medicine | Admitting: Emergency Medicine

## 2017-08-15 ENCOUNTER — Encounter (HOSPITAL_COMMUNITY): Payer: Self-pay | Admitting: Emergency Medicine

## 2017-08-15 DIAGNOSIS — R519 Headache, unspecified: Secondary | ICD-10-CM

## 2017-08-15 DIAGNOSIS — R51 Headache: Secondary | ICD-10-CM | POA: Diagnosis not present

## 2017-08-15 DIAGNOSIS — E039 Hypothyroidism, unspecified: Secondary | ICD-10-CM | POA: Insufficient documentation

## 2017-08-15 DIAGNOSIS — I10 Essential (primary) hypertension: Secondary | ICD-10-CM | POA: Diagnosis not present

## 2017-08-15 DIAGNOSIS — F17210 Nicotine dependence, cigarettes, uncomplicated: Secondary | ICD-10-CM | POA: Diagnosis not present

## 2017-08-15 DIAGNOSIS — Z79899 Other long term (current) drug therapy: Secondary | ICD-10-CM | POA: Insufficient documentation

## 2017-08-15 LAB — BASIC METABOLIC PANEL
ANION GAP: 7 (ref 5–15)
BUN: 12 mg/dL (ref 6–20)
CO2: 27 mmol/L (ref 22–32)
Calcium: 9.4 mg/dL (ref 8.9–10.3)
Chloride: 105 mmol/L (ref 101–111)
Creatinine, Ser: 0.83 mg/dL (ref 0.44–1.00)
GFR calc Af Amer: >60 mL/min (ref >60)
GFR calc non Af Amer: >60 mL/min (ref >60)
GLUCOSE: 97 mg/dL (ref 65–99)
Potassium: 4.1 mmol/L (ref 3.5–5.1)
Sodium: 139 mmol/L (ref 135–145)

## 2017-08-15 MED ORDER — HYDROCHLOROTHIAZIDE 12.5 MG PO CAPS: 12.5000 mg | ORAL_CAPSULE | Freq: Every day | ORAL | 0 refills | Status: DC

## 2017-08-15 MED ORDER — HYDROCHLOROTHIAZIDE 12.5 MG PO CAPS
12.5000 mg | ORAL_CAPSULE | Freq: Every day | ORAL | Status: DC
  Filled 2017-08-15 (×2): qty 1

## 2017-08-15 MED ORDER — HYDROCHLOROTHIAZIDE 25 MG PO TABS
12.5000 mg | ORAL_TABLET | Freq: Every day | ORAL | Status: AC
  Administered 2017-08-15: 12.5 mg via ORAL
  Filled 2017-08-15: qty 1

## 2017-08-15 MED ORDER — IBUPROFEN 400 MG PO TABS
400.0000 mg | ORAL_TABLET | Freq: Once | ORAL | Status: AC
Start: 2017-08-15 — End: 2017-08-15
  Administered 2017-08-15: 400 mg via ORAL
  Filled 2017-08-15: qty 1

## 2017-08-15 MED ORDER — BUTALBITAL-APAP-CAFF-COD 50-325-40-30 MG PO CAPS: 1.0000 | ORAL_CAPSULE | ORAL | 0 refills | Status: DC | PRN

## 2017-08-15 MED ORDER — ACETAMINOPHEN 500 MG PO TABS
1000.0000 mg | ORAL_TABLET | Freq: Once | ORAL | Status: AC
  Administered 2017-08-15: 1000 mg via ORAL
  Filled 2017-08-15: qty 2

## 2017-08-15 NOTE — ED Triage Notes (Signed)
Pt states she was at work today and was sent home for high BP. States she has had a headache that started yesterday. No history/ no meds for bp

## 2017-08-15 NOTE — ED Notes (Signed)
Waiting for Microzide from pharmacy.

## 2017-08-15 NOTE — ED Provider Notes (Signed)
[facility] Provider Note   CSN: [id number] Arrival date & time: 08/15/17  0800     History   Chief Complaint Chief Complaint  Patient presents with  . Hypertension    HPI Vanessa Powell is a 41 y.o. female.  Patient is a 41 year old female who presents to the emergency department with complaint of headache and hypertension.  Patient states her headache started on yesterday. Headache started in the frontal area and moved back of the head. The patient states she does not have a history or diagnosis related to headaches. She's not had any recent injury to the head. There's been no recent changes in her diet or in her medications. She has no history of hypertension. No recent fever chills or symptoms of upper respiratory infection. She states she does have problems with sinus congestion from time to time. There's been no recent loss of consciousness or change in mental status. There's been no difficulty with her speech, or using upper or lower extremities, or sense of balance. The patient had her blood pressure checked at her job and it was noted to be elevated. She was sent to the emergency department for additional evaluation and management.   The history is provided by the patient.  Hypertension  Associated symptoms include headaches. Pertinent negatives include no chest pain, no abdominal pain and no shortness of breath.    Past Medical History:  Diagnosis Date  . Abscess   . Gestational diabetes mellitus, antepartum   . Hypothyroidism   . Thyroid disease     Patient Active Problem List   Diagnosis Date Noted  . Abnormal maternal glucose tolerance, antepartum 02/09/2014  . 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 Smoker 10/24/2013  . Hypothyroid 10/21/2013    Past Surgical History:  Procedure Laterality Date  . ANKLE SURGERY    . CHOLECYSTECTOMY    . TUBAL LIGATION N/A 02/28/2014   Procedure: POST PARTUM TUBAL LIGATION;  Surgeon: Reva Bores, MD;  Location: [facility];   Service: Gynecology;  Laterality: N/A;    OB History    Gravida Para Term Preterm AB Living   SAB TAB Ectopic Multiple Live Births           3       Home Medications    Prior to Admission medications   Medication Sig Start Date End Date Taking? Authorizing Provider  benzonatate (TESSALON) 200 MG capsule Take 1 capsule (200 mg total) by mouth 3 (three) times daily as needed for cough. Swallow whole, do not chew 05/08/17   Triplett, Tammy, PA-C  levothyroxine (SYNTHROID) 100 MCG tablet Take 1 tablet (100 mcg total) by mouth daily before breakfast. 03/22/14   Cresenzo-Dishmon, Scarlette Calico, CNM  omeprazole (PRILOSEC) 20 MG capsule  03/28/14   [provider]  predniSONE (DELTASONE) 20 MG tablet Take 2 tablets (40 mg total) by mouth daily. 05/08/17   Pauline Aus, PA-C    Family History Family History  Problem Relation Age of Onset  . Diabetes Mother   . Hypertension Mother   . Thyroid disease Maternal Grandmother   . Cancer Maternal Grandfather        lung  . Hypertension Maternal Grandfather   . Cancer Other        lung  . Kidney disease Other   . Cancer Paternal Grandmother     Social History Social History  Substance Use Topics  . Smoking status: Current Every Day Smoker  Packs/day: 0.50    Types: Cigarettes  . Smokeless tobacco: Never Used  . Alcohol use No     Allergies   Sulfa antibiotics and Sulfur   Review of Systems Review of Systems  Constitutional: Negative for activity change, appetite change, chills and fever.       All ROS Neg except as noted in HPI  HENT: Negative for nosebleeds.   Eyes: Negative for photophobia and discharge.  Respiratory: Negative for cough, shortness of breath and wheezing.   Cardiovascular: Negative for chest pain and palpitations.  Gastrointestinal: Negative for abdominal pain and blood in stool.  Genitourinary: Negative for dysuria, frequency and hematuria.  Musculoskeletal: Negative for arthralgias,  back pain and neck pain.  Skin: Negative.   Neurological: Positive for headaches. Negative for dizziness, seizures and speech difficulty.  Psychiatric/Behavioral: Negative for confusion and hallucinations.     Physical Exam Updated Vital Signs BP (!) 152/97 (BP Location: Left Arm)   Pulse 81   Temp 97.8 F (36.6 C) (Oral)   Resp 18   Ht 5\' 7"  (1.702 m)   Wt 83.5 kg (184 lb)   LMP 08/10/2017   SpO2 99%   BMI 28.82 kg/m   Physical Exam  Constitutional: She is oriented to person, place, and time. She appears well-developed and well-nourished.  Non-toxic appearance. No distress.  HENT:  Head: Normocephalic and atraumatic.  Right Ear: Tympanic membrane and external ear normal.  Left Ear: Tympanic membrane and external ear normal.  Eyes: Pupils are equal, round, and reactive to light. Conjunctivae, EOM and lids are normal. Right eye exhibits no discharge. Left eye exhibits no discharge. No scleral icterus.  Neck: Normal range of motion. Neck supple. Carotid bruit is not present. No tracheal deviation present.  Cardiovascular: Normal rate, regular rhythm, normal heart sounds, intact distal pulses and normal pulses.   Pulmonary/Chest: Effort normal and breath sounds normal. No stridor. No respiratory distress. She has no wheezes. She has no rales.  Abdominal: Soft. Bowel sounds are normal. She exhibits no distension. There is no tenderness. There is no rebound and no guarding.  Musculoskeletal: Normal range of motion. She exhibits no edema or tenderness.  Lymphadenopathy:       Head (right side): No submandibular adenopathy present.       Head (left side): No submandibular adenopathy present.    She has no cervical adenopathy.  Neurological: She is alert and oriented to person, place, and time. She has normal strength. No cranial nerve deficit or sensory deficit. She exhibits normal muscle tone. She displays no seizure activity. Coordination normal.  Skin: Skin is warm and dry. No rash  noted.  Psychiatric: She has a normal mood and affect. Her speech is normal.  Nursing note and vitals reviewed.    ED Treatments / Results  Labs (all labs ordered are listed, but only abnormal results are displayed) Labs Reviewed  BASIC METABOLIC PANEL    EKG  EKG Interpretation None       Radiology No results found.  Procedures Procedures (including critical care time)  Medications Ordered in ED Medications  hydrochlorothiazide (MICROZIDE) capsule 12.5 mg (not administered)     Initial Impression / Assessment and Plan / ED Course  I have reviewed the triage vital signs and the nursing notes.  Pertinent labs & imaging results that were available during my care of the patient were reviewed by me and considered in my medical decision making (see chart for details).       Final Clinical  Impressions(s) / ED Diagnoses MDM Patient's blood pressure upon arrival was 152/97. Blood pressure was 155/100 at the time of my examination. Vital signs are otherwise within normal limits. No gross neurologic deficit appreciated on examination. Will evaluate basic metabolic panel. Recheck. No gross neurologic deficits appreciated. Patient is Being on her phone without problem. No significant change in headache. Patient is waiting to receive medication for blood pressure elevation.  Basic metabolic panel is well within normal limits.  I've asked the patient to call her primary care physician on September 10.rescription for hydrochlorothiazide 12.5 given to the patient to use. Patient will use Tylenol and ibuprofen for mild pain. Patient will use Fioricet-Codeine for more severe headache pain. Patient will return to the emergency department if any changes, problems, or concerns prior to her visit with her primary physician.   Final diagnoses:  Essential hypertension  Nonintractable headache, unspecified chronicity pattern, unspecified headache type    New Prescriptions New  Prescriptions   BUTALBITAL-ACETAMINOPHEN-CAFFEINE (FIORICET/CODEINE) 50-325-40-30 MG CAPSULE    Take 1 capsule by mouth every 4 (four) hours as needed for headache.   HYDROCHLOROTHIAZIDE (MICROZIDE) 12.5 MG CAPSULE    Take 1 capsule (12.5 mg total) by mouth daily.     Ivery Quale, PA-C 08/15/17 1017    Lavera Guise, MD 08/15/17 [phone]

## 2017-08-15 NOTE — ED Notes (Signed)
EDP at bedside updating patient and family. 

## 2017-08-15 NOTE — Discharge Instructions (Signed)
Our blood pressure has been between 152/97 and 155/100 today. Please use the hydrochlorothiazide daily. Please eat a banana, or leafy green salad daily while taking this particular medication. Please take this medication with you to see Dr.Sasser. Se 500 mg of ibuprofen, and 600 mg of ibuprofen every 6 hours for mild pain. Use Fioricet- codeine for more severe headache pain. This medication may cause drowsiness, and/or lightheadedness. Please do notdrive a vehicle, operating machinery, drink alcohol, or participated in activities requiring concentration when taking this medication. Please call Dr.Sasser on Monday, September 10 for office appointment and evaluation of your blood pressure as soon as possible.

## 2017-08-15 NOTE — ED Notes (Signed)
EDP at bedside  

## 2017-10-05 ENCOUNTER — Encounter (HOSPITAL_COMMUNITY): Payer: Self-pay | Admitting: Emergency Medicine

## 2017-10-05 ENCOUNTER — Emergency Department (HOSPITAL_COMMUNITY): Payer: PRIVATE HEALTH INSURANCE

## 2017-10-05 ENCOUNTER — Emergency Department (HOSPITAL_COMMUNITY)
Admission: EM | Admit: 2017-10-05 | Discharge: 2017-10-05 | Disposition: A | Discharge status: Home / Self Care | Payer: PRIVATE HEALTH INSURANCE | Attending: Emergency Medicine | Admitting: Emergency Medicine

## 2017-10-05 DIAGNOSIS — E039 Hypothyroidism, unspecified: Secondary | ICD-10-CM | POA: Diagnosis not present

## 2017-10-05 DIAGNOSIS — R82998 Other abnormal findings in urine: Secondary | ICD-10-CM | POA: Diagnosis not present

## 2017-10-05 DIAGNOSIS — R69 Illness, unspecified: Secondary | ICD-10-CM | POA: Insufficient documentation

## 2017-10-05 DIAGNOSIS — R509 Fever, unspecified: Secondary | ICD-10-CM | POA: Diagnosis present

## 2017-10-05 DIAGNOSIS — J111 Influenza due to unidentified influenza virus with other respiratory manifestations: Secondary | ICD-10-CM

## 2017-10-05 DIAGNOSIS — F17210 Nicotine dependence, cigarettes, uncomplicated: Secondary | ICD-10-CM | POA: Diagnosis not present

## 2017-10-05 DIAGNOSIS — Z79899 Other long term (current) drug therapy: Secondary | ICD-10-CM | POA: Insufficient documentation

## 2017-10-05 LAB — URINALYSIS, ROUTINE W REFLEX MICROSCOPIC
Bilirubin Urine: NEGATIVE
Glucose, UA: NEGATIVE mg/dL
Ketones, ur: NEGATIVE mg/dL
Leukocytes, UA: NEGATIVE
Nitrite: POSITIVE — AB
Protein, ur: 30 mg/dL — AB
Specific Gravity, Urine: 1.017 (ref 1.005–1.030)
pH: 6 (ref 5.0–8.0)

## 2017-10-05 LAB — PREGNANCY, URINE: Preg Test, Ur: NEGATIVE

## 2017-10-05 MED ORDER — ACETAMINOPHEN 325 MG PO TABS
650.0000 mg | ORAL_TABLET | Freq: Once | ORAL | Status: AC
  Administered 2017-10-05: 650 mg via ORAL
  Filled 2017-10-05: qty 2

## 2017-10-05 NOTE — ED Triage Notes (Signed)
General malaise and fever since Friday. Flu shot one week ago. Denies other symptoms.

## 2017-10-05 NOTE — ED Provider Notes (Signed)
[facility] EMERGENCY DEPARTMENT Provider Note   CSN: [id number] Arrival date & time: 10/05/17  1518     History   Chief Complaint Chief Complaint  Patient presents with  . Fever    HPI Vanessa Powell is a 41 y.o. female sheet with history of gestational diabetes and hypothyroidism presents with chief complaint acute onset, waxing and waning fever for 4 days. She states that she has been experiencing generalized myalgias "I feel like I was hit by a truck "and low-grade fever at home Tmax 100F. she did receive her flu shot 1 week prior to symptom onset. No aggravating or alleviating factors noted. She has been taking ibuprofen which has been somewhat helpful but states "it did not touch it yesterday". She denies SOB, CP, cough, nasal congestion, ear pain, headaches, neck pain or stiffness, abdominal pain, nausea, vomiting, urinary symptoms. She denies recent travel. She denies any known sick contacts. She is currently on her menstrual cycle. No known insect bites or tick bites.   The history is provided by the patient.    Past Medical History:  Diagnosis Date  . Abscess   . Gestational diabetes mellitus, antepartum   . Hypothyroidism   . Thyroid disease     Patient Active Problem List   Diagnosis Date Noted  . Abnormal maternal glucose tolerance, antepartum 02/09/2014  . 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 Smoker 10/24/2013  . Hypothyroid 10/21/2013    Past Surgical History:  Procedure Laterality Date  . ANKLE SURGERY    . CHOLECYSTECTOMY    . TUBAL LIGATION N/A 02/28/2014   Procedure: POST PARTUM TUBAL LIGATION;  Surgeon: Reva Bores, MD;  Location: [facility];  Service: Gynecology;  Laterality: N/A;    OB History    Gravida Para Term Preterm AB Living   4 3 3     3    SAB TAB Ectopic Multiple Live Births           3       Home Medications    Prior to Admission medications   Medication Sig Start Date End Date Taking? Authorizing Provider  benzonatate (TESSALON)  200 MG capsule Take 1 capsule (200 mg total) by mouth 3 (three) times daily as needed for cough. Swallow whole, do not chew Patient not taking: Reported on 08/15/2017 05/08/17   Pauline Aus, PA-C  butalbital-acetaminophen-caffeine (FIORICET/CODEINE) 50-325-40-30 MG capsule Take 1 capsule by mouth every 4 (four) hours as needed for headache. 08/15/17   Ivery Quale, PA-C  hydrochlorothiazide (MICROZIDE) 12.5 MG capsule Take 1 capsule (12.5 mg total) by mouth daily. 08/15/17   Ivery Quale, PA-C  predniSONE (DELTASONE) 20 MG tablet Take 2 tablets (40 mg total) by mouth daily. Patient not taking: Reported on 08/15/2017 05/08/17   Pauline Aus, PA-C    Family History Family History  Problem Relation Age of Onset  . Diabetes Mother   . Hypertension Mother   . Thyroid disease Maternal Grandmother   . Cancer Maternal Grandfather        lung  . Hypertension Maternal Grandfather   . Cancer Other        lung  . Kidney disease Other   . Cancer Paternal Grandmother     Social History Social History  Substance Use Topics  . Smoking status: Current Every Day Smoker    Packs/day: 0.50    Types: Cigarettes  . Smokeless tobacco: Never Used  . Alcohol use No     Allergies   Sulfa antibiotics and Sulfur  Review of Systems Review of Systems  Constitutional: Positive for fever.  HENT: Negative for congestion, ear pain, facial swelling, sore throat and trouble swallowing.   Respiratory: Negative for shortness of breath.   Cardiovascular: Negative for chest pain.  Gastrointestinal: Negative for abdominal pain, nausea and vomiting.  Genitourinary: Positive for vaginal bleeding (menstruating). Negative for decreased urine volume, dysuria and hematuria.  Musculoskeletal: Positive for myalgias. Negative for neck pain and neck stiffness.  Neurological: Negative for headaches.  All other systems reviewed and are negative.    Physical Exam Updated Vital Signs BP 114/62   Pulse 82   Temp  99.7 F (37.6 C) (Oral)   Resp 18   SpO2 94%   Physical Exam  Constitutional: She is oriented to person, place, and time. She appears well-developed and well-nourished. No distress.  HENT:  Head: Normocephalic and atraumatic.  Right Ear: Tympanic membrane, external ear and ear canal normal. No tenderness. No mastoid tenderness. Tympanic membrane is not injected. No hemotympanum.  Left Ear: Tympanic membrane, external ear and ear canal normal. No tenderness. No mastoid tenderness. Tympanic membrane is not injected. No hemotympanum.  Nose: No mucosal edema, rhinorrhea, sinus tenderness, septal deviation or nasal septal hematoma. Right sinus exhibits no maxillary sinus tenderness and no frontal sinus tenderness. Left sinus exhibits no maxillary sinus tenderness and no frontal sinus tenderness.  Mouth/Throat: Uvula is midline and oropharynx is clear and moist. No oral lesions. No uvula swelling. No oropharyngeal exudate, posterior oropharyngeal edema, posterior oropharyngeal erythema or tonsillar abscesses.  Eyes: Conjunctivae are normal. Right eye exhibits no discharge. Left eye exhibits no discharge.  Neck: Normal range of motion. Neck supple. No JVD present. No tracheal deviation present.  No midline spine TTP, no paraspinal muscle tenderness, no deformity, crepitus, or step-off noted. Normal range of motion of the neck. No meningeal signs.  Cardiovascular: Normal rate, regular rhythm and normal heart sounds.   Pulmonary/Chest: Effort normal and breath sounds normal. No respiratory distress. She has no wheezes. She has no rales.  Abdominal: Soft. Bowel sounds are normal. She exhibits no distension. There is no tenderness.  Musculoskeletal: Normal range of motion. She exhibits no edema or tenderness.  Lymphadenopathy:    She has no cervical adenopathy.  Neurological: She is alert and oriented to person, place, and time.  Skin: Skin is warm and dry. She is not diaphoretic. No erythema.    Psychiatric: She has a normal mood and affect. Her behavior is normal.  Nursing note and vitals reviewed.    ED Treatments / Results  Labs (all labs ordered are listed, but only abnormal results are displayed) Labs Reviewed  URINALYSIS, ROUTINE W REFLEX MICROSCOPIC - Abnormal; Notable for the following:       Result Value   APPearance HAZY (*)    Hgb urine dipstick LARGE (*)    Protein, ur 30 (*)    Nitrite POSITIVE (*)    Bacteria, UA RARE (*)    Squamous Epithelial / LPF 0-5 (*)    All other components within normal limits  URINE CULTURE  PREGNANCY, URINE    EKG  EKG Interpretation None       Radiology Dg Chest 2 View  Result Date: 10/05/2017 CLINICAL DATA:  Fever and 16 is. EXAM: CHEST  2 VIEW COMPARISON:  None. FINDINGS: The cardiomediastinal silhouette is normal in size. Normal pulmonary vascularity. Blunting of the right costophrenic angle may be due to chronic scarring, as no definite pleural effusion is seen on the lateral view.  Prominent left epicardial fat pad. Nodular density projecting near the left costophrenic sulcus likely represents a nipple shadow. Mild bibasilar atelectasis. No consolidation or pneumothorax. Emphysematous changes in the bilateral upper lobes. No acute osseous abnormality. IMPRESSION: Emphysema.  No definite acute cardiopulmonary process. Electronically Signed   By: Obie Dredge M.D.   On: 10/05/2017 16:59    Procedures Procedures (including critical care time)  Medications Ordered in ED Medications  acetaminophen (TYLENOL) tablet 650 mg (650 mg Oral Given 10/05/17 1559)     Initial Impression / Assessment and Plan / ED Course  I have reviewed the triage vital signs and the nursing notes.  Pertinent labs & imaging results that were available during my care of the patient were reviewed by me and considered in my medical decision making (see chart for details).     Patient with fever and generalized myalgias for 4 days.  Initially febrile at 101.68F and tachycardic with improvement in her vital signs after administration of Tylenol. No headache or meningeal signs to suggest meningitis. No focal neurological deficits on examination. Examination otherwise unremarkable. Chest x-ray shows no evidence of pneumonia or other acute cardiopulmonary abnormalities. UA shows nitrites and rare bacteria but generally is not suggestive of UTI and she has no urinary symptoms. Will send for culture. Low suspicion of thyroid storm given low-grade fever and no other symptoms. Low suspicion of Lyme disease or [facility] spotted fever in the absence of exposure. She is a low risk patient and his outside of the recommended window for administration of Tamiflu. Symptoms likely related to viral infection. She is stable for discharge home with symptomatic treatment. Discussed indications for return to the ED. Recommend follow-up with primary care physician in the next 3-4 days if symptoms do not improve. Patient and patient's husband verbalized understanding of and agreement with plan and patient is stable for discharge home at this time.  Final Clinical Impressions(s) / ED Diagnoses   Final diagnoses:  Influenza-like illness    New Prescriptions Discharge Medication List as of 10/05/2017  5:30 PM       Jeanie Sewer, PA-C 10/05/17 1832    Loren Racer, MD 10/09/17 1331

## 2017-10-05 NOTE — Discharge Instructions (Signed)
Alternate 600 mg of ibuprofen and [phone] mg of Tylenol every 3 hours as needed for fever and muscle aches. Do not exceed 4000 mg of Tylenol daily. Drink plenty of fluids and get plenty of rest. Follow-up with your primary care physician in the next 2-3 days if her symptoms do not improve. Return to the ED immediately if she develop any concerning signs or symptoms such as fever greater than 102F not controlled by ibuprofen or Tylenol, severe headache, neck stiffness, cough or difficulty breathing, abdominal pain, or urinary symptoms.

## 2017-10-07 LAB — URINE CULTURE

## 2017-10-08 ENCOUNTER — Encounter (HOSPITAL_COMMUNITY): Payer: Self-pay | Admitting: Emergency Medicine

## 2017-10-08 ENCOUNTER — Emergency Department (HOSPITAL_COMMUNITY)
Admission: EM | Admit: 2017-10-08 | Discharge: 2017-10-08 | Disposition: A | Discharge status: Home / Self Care | Payer: PRIVATE HEALTH INSURANCE | Attending: Emergency Medicine | Admitting: Emergency Medicine

## 2017-10-08 ENCOUNTER — Emergency Department (HOSPITAL_COMMUNITY): Payer: PRIVATE HEALTH INSURANCE

## 2017-10-08 DIAGNOSIS — D696 Thrombocytopenia, unspecified: Secondary | ICD-10-CM | POA: Insufficient documentation

## 2017-10-08 DIAGNOSIS — F17210 Nicotine dependence, cigarettes, uncomplicated: Secondary | ICD-10-CM | POA: Diagnosis not present

## 2017-10-08 DIAGNOSIS — R9431 Abnormal electrocardiogram [ECG] [EKG]: Secondary | ICD-10-CM | POA: Insufficient documentation

## 2017-10-08 DIAGNOSIS — E039 Hypothyroidism, unspecified: Secondary | ICD-10-CM | POA: Diagnosis not present

## 2017-10-08 DIAGNOSIS — Z8632 Personal history of gestational diabetes: Secondary | ICD-10-CM | POA: Diagnosis not present

## 2017-10-08 DIAGNOSIS — R079 Chest pain, unspecified: Secondary | ICD-10-CM | POA: Diagnosis present

## 2017-10-08 LAB — CBC
HCT: 42.8 % (ref 36.0–46.0)
HEMOGLOBIN: 14.3 g/dL (ref 12.0–15.0)
MCH: 32.2 pg (ref 26.0–34.0)
MCHC: 33.4 g/dL (ref 30.0–36.0)
MCV: 96.4 fL (ref 78.0–100.0)
Platelets: 84 10*3/uL — ABNORMAL LOW (ref 150–400)
RBC: 4.44 MIL/uL (ref 3.87–5.11)
RDW: 13.1 % (ref 11.5–15.5)
WBC: 3.3 10*3/uL — ABNORMAL LOW (ref 4.0–10.5)

## 2017-10-08 LAB — BASIC METABOLIC PANEL
Anion gap: 10 (ref 5–15)
BUN: 12 mg/dL (ref 6–20)
CHLORIDE: 103 mmol/L (ref 101–111)
CO2: 27 mmol/L (ref 22–32)
Calcium: 9.2 mg/dL (ref 8.9–10.3)
Creatinine, Ser: 0.83 mg/dL (ref 0.44–1.00)
GFR calc Af Amer: >60 mL/min (ref >60)
GFR calc non Af Amer: >60 mL/min (ref >60)
Glucose, Bld: 87 mg/dL (ref 65–99)
Potassium: 3.9 mmol/L (ref 3.5–5.1)
Sodium: 140 mmol/L (ref 135–145)

## 2017-10-08 LAB — TROPONIN I: Troponin I: <0.03 ng/mL (ref <0.03)

## 2017-10-08 LAB — TSH: TSH: 104.398 u[IU]/mL — AB (ref 0.350–4.500)

## 2017-10-08 MED ORDER — ASPIRIN 81 MG PO CHEW
324.0000 mg | CHEWABLE_TABLET | Freq: Once | ORAL | Status: AC
  Administered 2017-10-08: 324 mg via ORAL
  Filled 2017-10-08: qty 4

## 2017-10-08 MED ORDER — ASPIRIN 81 MG PO CHEW
CHEWABLE_TABLET | ORAL | Status: AC
  Filled 2017-10-08: qty 1

## 2017-10-08 MED ORDER — LEVOTHYROXINE SODIUM 50 MCG PO TABS: 50.0000 ug | ORAL_TABLET | Freq: Every day | ORAL | 0 refills | Status: AC

## 2017-10-08 NOTE — ED Triage Notes (Signed)
PT c/o center chest tightness/heaviness x3 days with continued generalized malaise from past ED visit this week.

## 2017-10-08 NOTE — ED Provider Notes (Signed)
[facility] EMERGENCY DEPARTMENT Provider Note   CSN: [id number] Arrival date & time: 10/08/17  1245     History   Chief Complaint Chief Complaint  Patient presents with  . Chest Pain    HPI Vanessa Powell is a 41 y.o. female.  The patient is a 41 year old female, she has a known history of tobacco use for which she smokes 1 to 2-3 cigarettes/day.  She denies taking any daily medications, denies any significant medical history and states that she has no history of lung disease or heart disease.  Approximately 6 or 7 days ago the patient developed the onset of some chest heaviness, felt like she was having body aches and fevers, came to the emergency department for evaluation and had a negative workup including a chest x-ray and a urinalysis which showed no signs of infiltrate, no signs of urinary tract infection and a culture was negative for any specific bacterial infections.  Since that time her fevers have defervesced, over the last 3 days she has just felt a continual sense of a heaviness on her chest which is not made worse with ambulation with exertion with position or with deep breathing or eating.  She feels like when she is laying down things feel better.  There is no swelling of the legs, no back pain, no shortness of breath and no coughing.  There is no sore throat, no stuffy nose, no rhinorrhea or visual changes.  She has not been seen by family doctor.  She has not smoked in over 2 days.  She denies any prior cardiac history and denies any prior evaluation for cardiac problems.  Initially the patient felt as though she had the flu with the fever and the body aches but at this time she states the symptoms have resolved    Chest Pain        Past Medical History:  Diagnosis Date  . Abscess   . Gestational diabetes mellitus, antepartum   . Hypothyroidism   . Thyroid disease     Patient Active Problem List   Diagnosis Date Noted  . Abnormal maternal glucose tolerance,  antepartum 02/09/2014  . 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 Smoker 10/24/2013  . Hypothyroid 10/21/2013    Past Surgical History:  Procedure Laterality Date  . ANKLE SURGERY    . CHOLECYSTECTOMY    . TUBAL LIGATION N/A 02/28/2014   Procedure: POST PARTUM TUBAL LIGATION;  Surgeon: Reva Bores, MD;  Location: [facility];  Service: Gynecology;  Laterality: N/A;    OB History    Gravida Para Term Preterm AB Living   4 3 3     3    SAB TAB Ectopic Multiple Live Births           3       Home Medications    Prior to Admission medications   Medication Sig Start Date End Date Taking? Authorizing Provider  levothyroxine (SYNTHROID, LEVOTHROID) 50 MCG tablet Take 1 tablet (50 mcg total) by mouth daily before breakfast. 10/08/17   Eber Hong, MD    Family History Family History  Problem Relation Age of Onset  . Diabetes Mother   . Hypertension Mother   . Thyroid disease Maternal Grandmother   . Cancer Maternal Grandfather        lung  . Hypertension Maternal Grandfather   . Cancer Other        lung  . Kidney disease Other   . Cancer Paternal Grandmother  Social History Social History  Substance Use Topics  . Smoking status: Current Every Day Smoker    Packs/day: 0.50    Types: Cigarettes  . Smokeless tobacco: Never Used  . Alcohol use No     Allergies   Sulfa antibiotics and Sulfur   Review of Systems Review of Systems  Cardiovascular: Positive for chest pain.  All other systems reviewed and are negative.    Physical Exam Updated Vital Signs BP (!) 136/94   Pulse 70   Temp 98.1 F (36.7 C) (Oral)   Resp 15   Ht 5\' 7"  (1.702 m)   Wt 81.6 kg (180 lb)   LMP 10/01/2017   SpO2 97%   BMI 28.19 kg/m   Physical Exam  Constitutional: She appears well-developed and well-nourished. No distress.  HENT:  Head: Normocephalic and atraumatic.  Mouth/Throat: Oropharynx is clear and moist. No oropharyngeal exudate.  Mucous membranes are moist, tonsillar pillars  are normal, no uvular deviation, no exudate  Eyes: Pupils are equal, round, and reactive to light. Conjunctivae and EOM are normal. Right eye exhibits no discharge. Left eye exhibits no discharge. No scleral icterus.  Neck: Normal range of motion. Neck supple. No JVD present. No thyromegaly present.  No thyromegaly, very supple neck  Cardiovascular: Normal rate, regular rhythm, normal heart sounds and intact distal pulses.  Exam reveals no gallop and no friction rub.   No murmur heard. No murmurs, no edema, no JVD, normal pulses at radial arteries  Pulmonary/Chest: Effort normal and breath sounds normal. No respiratory distress. She has no wheezes. She has no rales.  Speaks in full sentences, clear lungs, no wheezing or rhonchi, no increased work of breathing  Abdominal: Soft. Bowel sounds are normal. She exhibits no distension and no mass. There is no tenderness.  Totally soft and nontender abdomen  Musculoskeletal: Normal range of motion. She exhibits no edema or tenderness.  Lymphadenopathy:    She has no cervical adenopathy.  Neurological: She is alert. Coordination normal.  Memory is intact, speech is clear, moves all 4 extremities without difficulty.  Cranial nerves III through XII appear intact  Skin: Skin is warm and dry. No rash noted. No erythema.  Psychiatric: She has a normal mood and affect. Her behavior is normal.  Nursing note and vitals reviewed.    ED Treatments / Results  Labs (all labs ordered are listed, but only abnormal results are displayed) Labs Reviewed  TSH - Abnormal; Notable for the following:       Result Value   TSH 104.398 (*)    All other components within normal limits  CBC - Abnormal; Notable for the following:    WBC 3.3 (*)    Platelets 84 (*)    All other components within normal limits  BASIC METABOLIC PANEL  TROPONIN I    EKG  EKG Interpretation  Date/Time:  Thursday October 08 2017 12:51:09 EDT Ventricular Rate:  76 PR  Interval:  156 QRS Duration: 78 QT Interval:  388 QTC Calculation: 436 R Axis:   85 Text Interpretation:  Normal sinus rhythm Cannot rule out Anterior infarct , age undetermined Abnormal ECG No old tracing to compare Confirmed by [place]Jacubowitz, Doreatha MartinSam [phone]([id number]) on 10/08/2017 2:51:27 PM       Radiology Dg Chest 2 View  Result Date: 10/08/2017 CLINICAL DATA:  Weakness, chest pain EXAM: CHEST  2 VIEW COMPARISON:  10/05/2017 FINDINGS: There is hyperinflation of the lungs compatible with COPD. Increased interstitial markings in the lung bases, likely  scarring. No acute confluent airspace opacity or effusion. No acute bony abnormality. IMPRESSION: COPD.  Bibasilar scarring.  No active disease. Electronically Signed   By: Charlett Nose M.D.   On: 10/08/2017 13:12    Procedures Procedures (including critical care time)  Medications Ordered in ED Medications  aspirin 81 MG chewable tablet (not administered)  aspirin chewable tablet 324 mg (324 mg Oral Given 10/08/17 1541)     Initial Impression / Assessment and Plan / ED Course  I have reviewed the triage vital signs and the nursing notes.  Pertinent labs & imaging results that were available during my care of the patient were reviewed by me and considered in my medical decision making (see chart for details).  Clinical Course as of Oct 09 1739  Thu Oct 08, 2017  1632 Platelets: Vela Prose [BM]  1632 Platelets: (!) 84 [BM]    Clinical Course User Index [BM] Eber Hong, MD    The patient's EKG is abnormal with T wave flattening or inversions in the inferior leads as well as the lateral precordial leads.  This does raise some concern, the rest of the EKG is unremarkable but has low voltage.  There is no old EKG with which to compare.  Her chest x-ray is unremarkable, labs have been added.  I will give aspirin.  I am concerned that this could be a coronary syndrome though the patient's symptoms have been rather persistent for the last several days  and a negative troponin would go along way to easing this question.  The patient has abnormal platelets, abnormal thyroid function, she can follow-up in the outpatient setting for both of these findings.  She has a negative troponin after having over 1 week of chest pain which has been constant and is nonexertional making this less likely to be an acute coronary syndrome.  She will be referred to cardiology and endocrinology as an outpatient.  She expressed understanding.  Given copy of all her results   Final Clinical Impressions(s) / ED Diagnoses   Final diagnoses:  Hypothyroidism, unspecified type  Thrombocytopenia (HCC)  Abnormal ECG    New Prescriptions New Prescriptions   LEVOTHYROXINE (SYNTHROID, LEVOTHROID) 50 MCG TABLET    Take 1 tablet (50 mcg total) by mouth daily before breakfast.     Eber Hong, MD 10/08/17 1740

## 2017-10-08 NOTE — Discharge Instructions (Signed)
Start synthroid (levothyroxine) daily ER for worsening symtpoms Call the cardiologist for evaluation of your chest discomfort

## 2017-10-08 NOTE — ED Notes (Signed)
EKG given to Dr. Jacobwitz. 

## 2017-11-03 NOTE — Progress Notes (Deleted)
Cardiology Office Note   Date:  11/03/2017   ID:  Vanessa SaneKimberly Duling, DOB May 29, 1976, MRN [id number]  PCP:  Estanislado PandySasser, Paul W, MD  Cardiologist:   Rollene RotundaJames Wyndell Cardiff, MD  Referring:  ***  No chief complaint on file.     History of Present Illness: Vanessa Powell is a 41 y.o. female who is referred by Estanislado PandySasser, Paul W, MD for evaluation of ***       Past Medical History:  Diagnosis Date  . Abscess   . Gestational diabetes mellitus, antepartum   . Hypothyroidism   . Thyroid disease     Past Surgical History:  Procedure Laterality Date  . ANKLE SURGERY    . CHOLECYSTECTOMY    . TUBAL LIGATION N/A 02/28/2014   Procedure: POST PARTUM TUBAL LIGATION;  Surgeon: Reva Boresanya S Pratt, MD;  Location: [facility];  Service: Gynecology;  Laterality: N/A;     Current Outpatient Medications  Medication Sig Dispense Refill  . levothyroxine (SYNTHROID, LEVOTHROID) 50 MCG tablet Take 1 tablet (50 mcg total) by mouth daily before breakfast. 30 tablet 0   No current facility-administered medications for this visit.     Allergies:   Sulfa antibiotics and Sulfur    Social History:  The patient  reports that she has been smoking cigarettes.  She has been smoking about 0.50 packs per day. she has never used smokeless tobacco. She reports that she does not drink alcohol or use drugs.   Family History:  The patient's ***family history includes Cancer in her maternal grandfather, other, and paternal grandmother; Diabetes in her mother; Hypertension in her maternal grandfather and mother; Kidney disease in her other; Thyroid disease in her maternal grandmother.    ROS:  Please see the history of present illness.   Otherwise, review of systems are positive for {NONE DEFAULTED:18576::"none"}.   All other systems are reviewed and negative.    PHYSICAL EXAM: VS:  There were no vitals taken for this visit. , BMI There is no height or weight on file to calculate BMI. GENERAL:  Well appearing HEENT:  Pupils equal  round and reactive, fundi not visualized, oral mucosa unremarkable NECK:  No jugular venous distention, waveform within normal limits, carotid upstroke brisk and symmetric, no bruits, no thyromegaly LYMPHATICS:  No cervical, inguinal adenopathy LUNGS:  Clear to auscultation bilaterally BACK:  No CVA tenderness CHEST:  Unremarkable HEART:  PMI not displaced or sustained,S1 and S2 within normal limits, no S3, no S4, no clicks, no rubs, *** murmurs ABD:  Flat, positive bowel sounds normal in frequency in pitch, no bruits, no rebound, no guarding, no midline pulsatile mass, no hepatomegaly, no splenomegaly EXT:  2 plus pulses throughout, no edema, no cyanosis no clubbing SKIN:  No rashes no nodules NEURO:  Cranial nerves II through XII grossly intact, motor grossly intact throughout PSYCH:  Cognitively intact, oriented to person place and time    EKG:  EKG {ACTION; IS/IS YQM:[id number]}OT:[id number]} ordered today. The ekg ordered today demonstrates ***   Recent Labs: 10/08/2017: BUN 12; Creatinine, Ser 0.83; Hemoglobin 14.3; Platelets 84; Potassium 3.9; Sodium 140; TSH 104.398    Lipid Panel No results found for: CHOL, TRIG, HDL, CHOLHDL, VLDL, LDLCALC, LDLDIRECT    Wt Readings from Last 3 Encounters:  10/08/17 180 lb (81.6 kg)  08/15/17 184 lb (83.5 kg)  05/08/17 180 lb (81.6 kg)      Other studies Reviewed: Additional studies/ records that were reviewed today include: ***. Review of the above records demonstrates:  Please see elsewhere in the note.  ***   ASSESSMENT AND PLAN:  ***   Current medicines are reviewed at length with the patient today.  The patient {ACTIONS; HAS/DOES NOT HAVE:19233} concerns regarding medicines.  The following changes have been made:  {PLAN; NO CHANGE:13088:s}  Labs/ tests ordered today include: *** No orders of the defined types were placed in this encounter.    Disposition:   FU with ***    Signed, Rollene RotundaJames Ruford Dudzinski, MD  11/03/2017 10:58 PM    [facility]

## 2017-11-05 ENCOUNTER — Ambulatory Visit: Payer: PRIVATE HEALTH INSURANCE | Admitting: Cardiology

## 2017-11-05 ENCOUNTER — Encounter: Payer: Self-pay | Admitting: Cardiology

## 2018-01-06 IMAGING — DX DG CHEST 2V
2 series · 2 of 2 positions shown · non-contrast
Comparison: 10/05/2017

CLINICAL DATA: Weakness, chest pain

EXAM:
CHEST  2 VIEW

[chest pa]
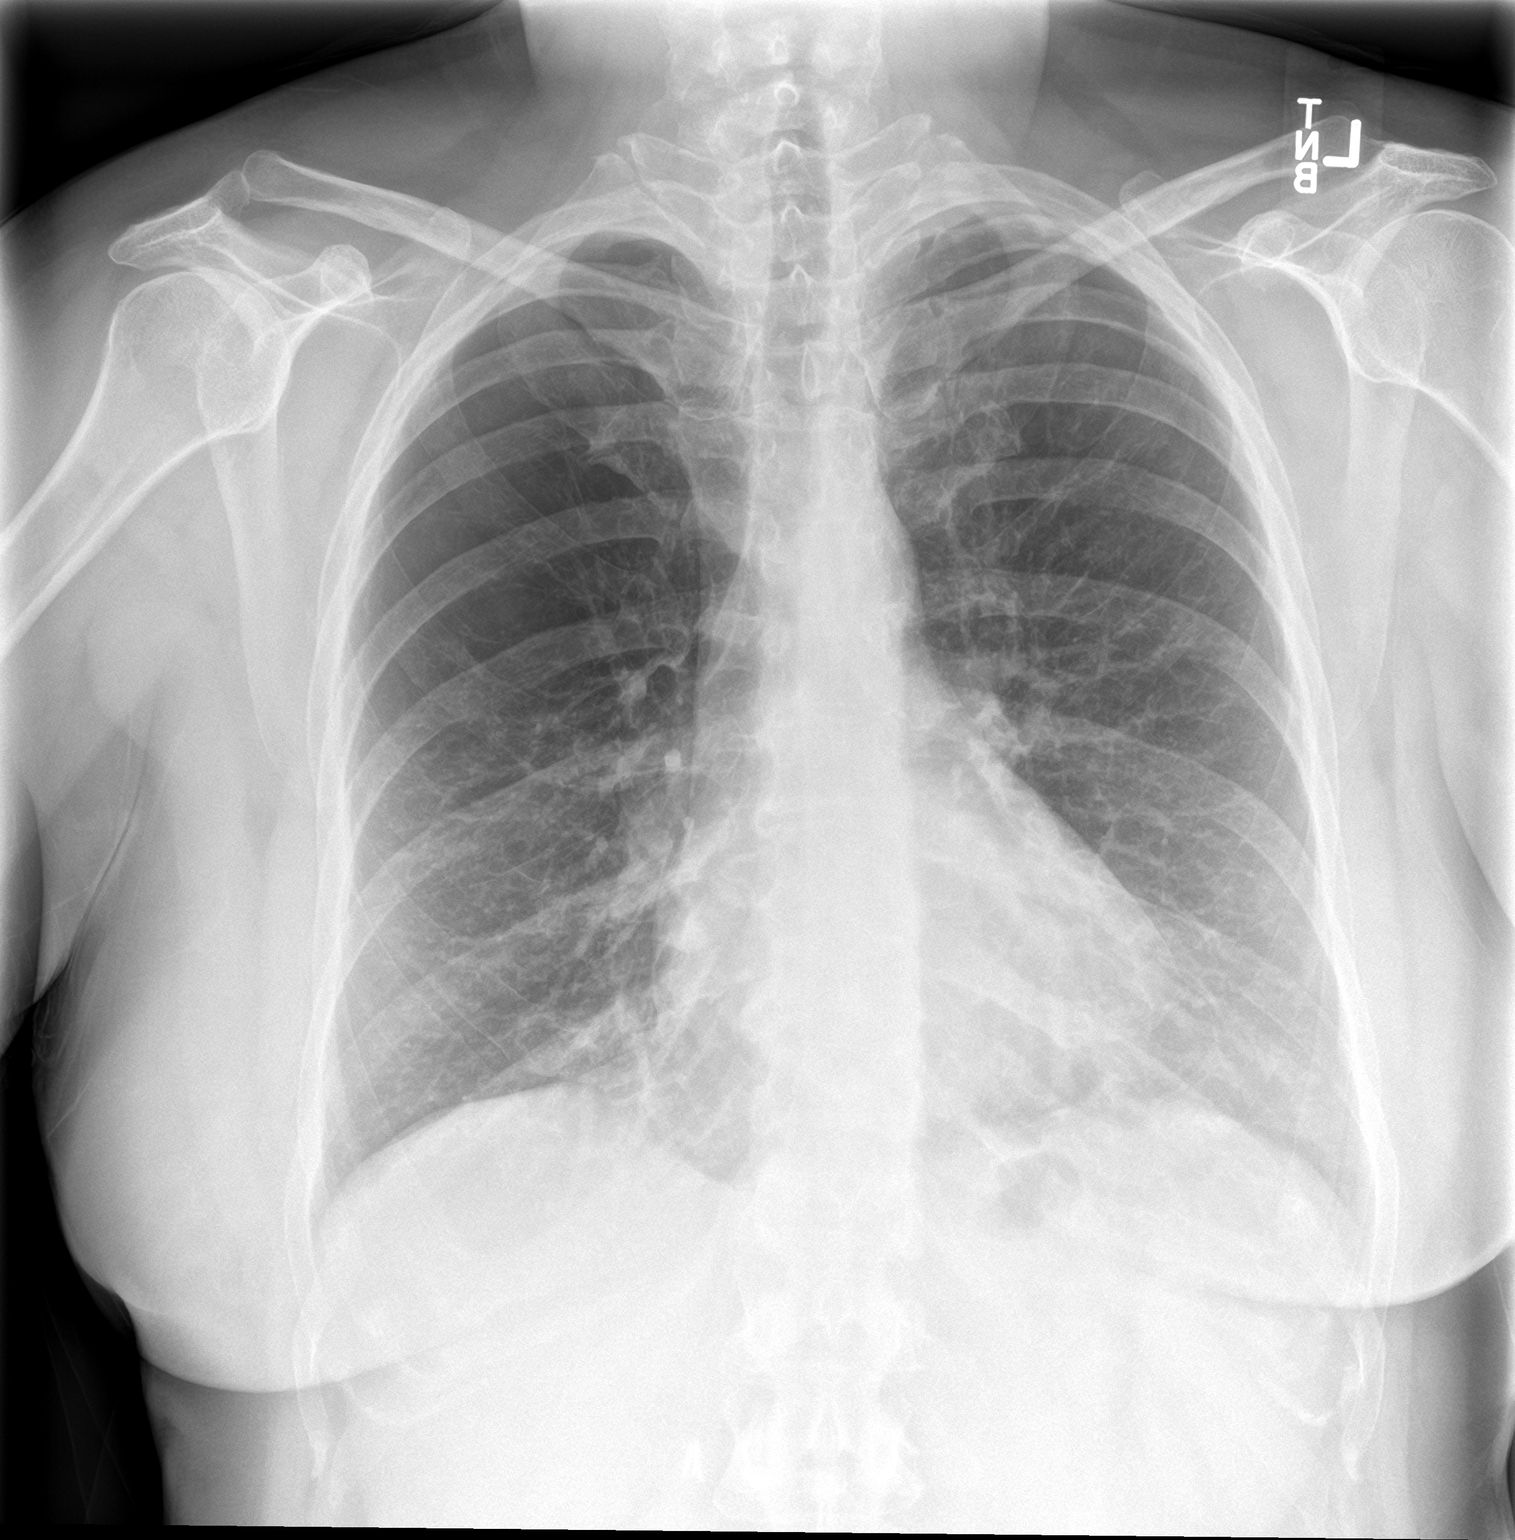

[chest lat]
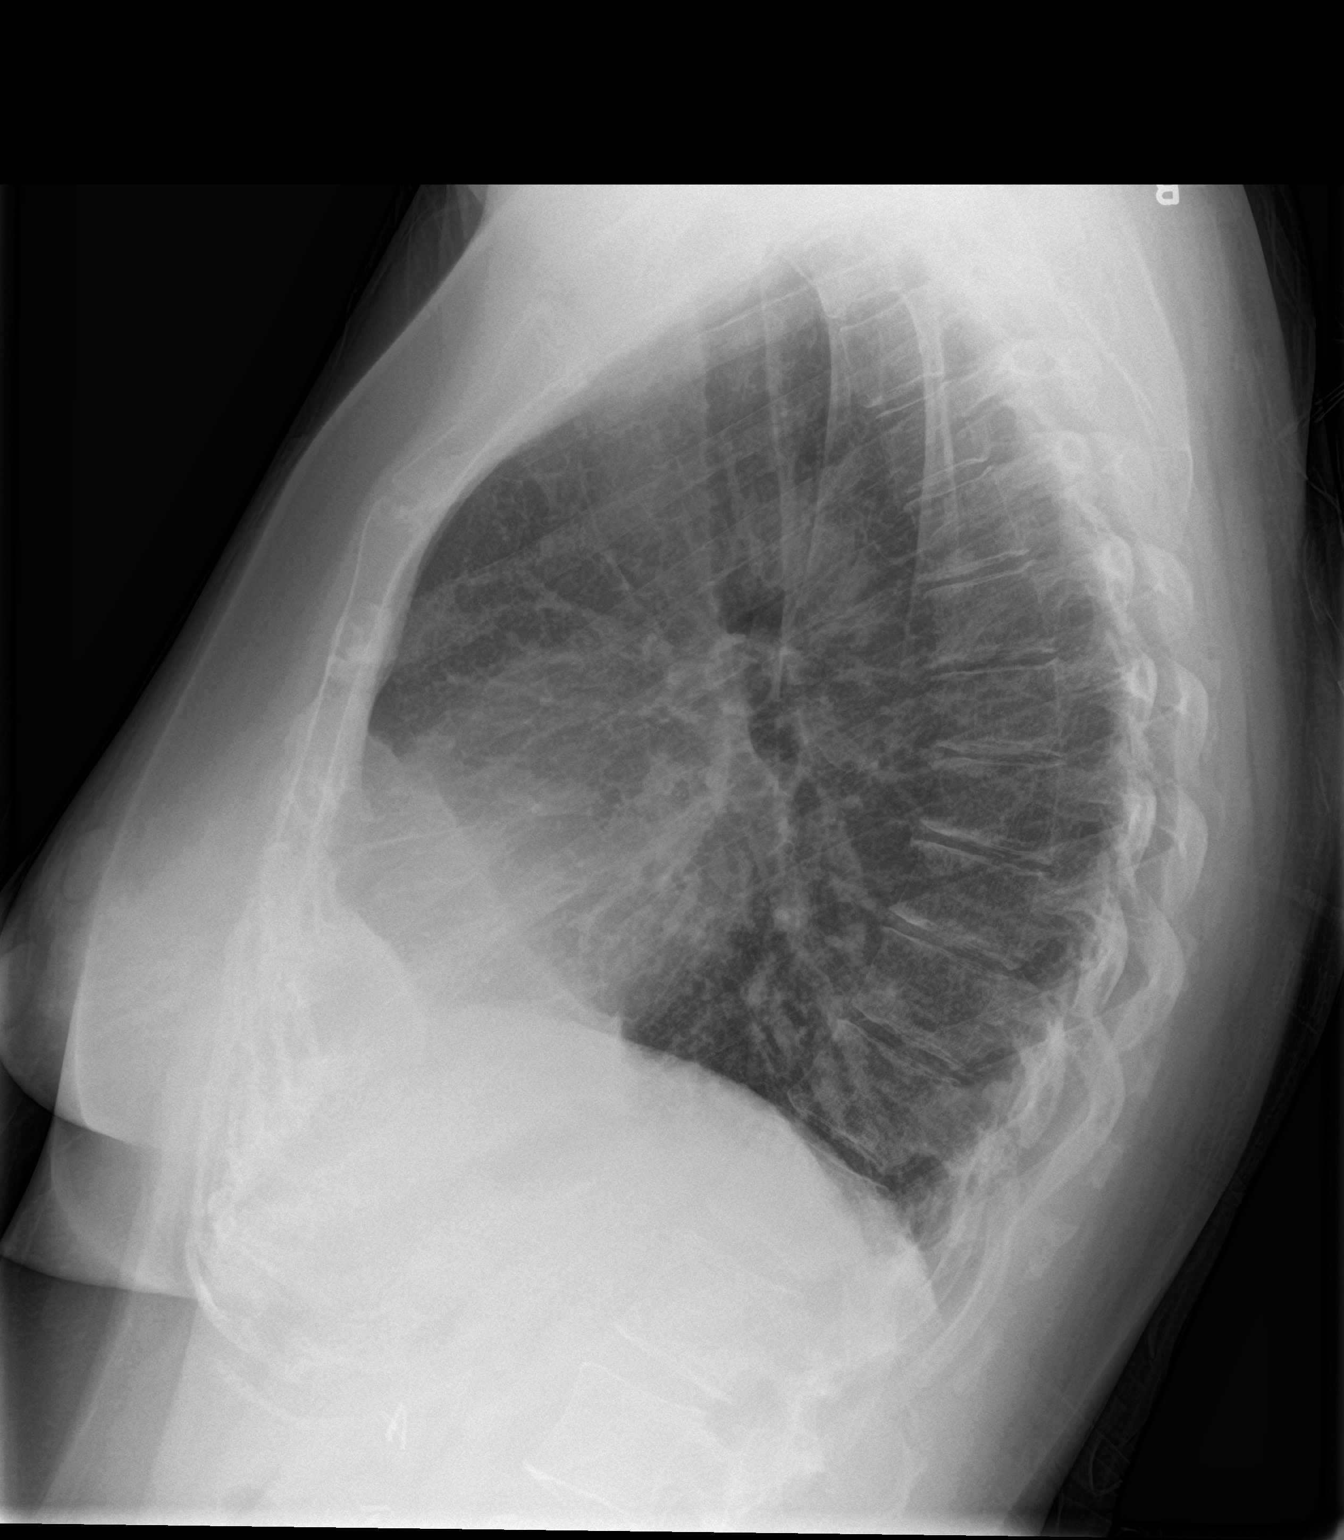

[2 of 2 positions shown; findings below may reference images not displayed]

FINDINGS: There is hyperinflation of the lungs compatible with COPD. Increased
interstitial markings in the lung bases, likely scarring. No acute
confluent airspace opacity or effusion. No acute bony abnormality.
IMPRESSION: COPD.  Bibasilar scarring.  No active disease.

## 2019-02-15 ENCOUNTER — Encounter: Payer: Self-pay | Admitting: Orthopaedic Surgery

## 2019-02-15 ENCOUNTER — Other Ambulatory Visit: Payer: Self-pay

## 2019-02-15 ENCOUNTER — Ambulatory Visit: Payer: No Typology Code available for payment source | Admitting: Orthopaedic Surgery

## 2019-02-15 VITALS — BP 129/89 | HR 89 | Ht 66.0 in | Wt 197.0 lb

## 2019-02-15 DIAGNOSIS — M25571 Pain in right ankle and joints of right foot: Secondary | ICD-10-CM

## 2019-02-15 DIAGNOSIS — G8929 Other chronic pain: Secondary | ICD-10-CM

## 2019-02-15 MED ORDER — PREDNISONE 5 MG (21) PO TBPK: ORAL_TABLET | ORAL | 0 refills | Status: DC

## 2019-02-15 NOTE — Progress Notes (Signed)
Subjective:    Patient ID: Vanessa Powell, female    DOB: Sep 20, 1976, 43 y.o.   MRN: [id number]  HPI She has right ankle pain for about a month or so.  She had surgery of the ankle, bimalleolar surgery, in 2011.  She did well until the last month when she started having medial pain and some swelling.  She has no trauma, no redness, no numbness.  The pain got worse. She went to the ER at [facility] on 02-01-2019 because of the pain. X-rays showed the prior surgery but well healed and no loose bodies.  I have the ER records and reports but she did not bring in the CD of the x-rays.  She has been on ibuprofen and soaked the ankle and used a brace but still has the pain.  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   Review of Systems  Constitutional: Positive for activity change.  Musculoskeletal: Positive for arthralgias, gait problem and joint swelling.  All other systems reviewed and are negative.  For Review of Systems, all other systems reviewed and are negative.  The following is a summary of the past history medically, past history surgically, known current medicines, social history and family history.  This information is gathered electronically by the computer from prior information and documentation.  I review this each visit and have found including this information at this point in the chart is beneficial and informative.   Past Medical History:  Diagnosis Date  . Abscess   . Gestational diabetes mellitus, antepartum   . Hypothyroidism   . Thyroid disease     Past Surgical History:  Procedure Laterality Date  . ANKLE SURGERY    . CHOLECYSTECTOMY    . TUBAL LIGATION N/A 02/28/2014   Procedure: POST PARTUM TUBAL LIGATION;  Surgeon: Reva Bores, MD;  Location: [facility];  Service: Gynecology;  Laterality: N/A;    Current Outpatient Medications on File Prior to Visit  Medication Sig Dispense Refill  . levothyroxine (SYNTHROID, LEVOTHROID) 50 MCG tablet Take 1  tablet (50 mcg total) by mouth daily before breakfast. 30 tablet 0   No current facility-administered medications on file prior to visit.     Social History   Socioeconomic History  . Marital status: Married    Spouse name: Not on file  . Number of children: Not on file  . Years of education: Not on file  . Highest education level: Not on file  Occupational History  . Not on file  Social Needs  . Financial resource strain: Not on file  . Food insecurity:    Worry: Not on file    Inability: Not on file  . Transportation needs:    Medical: Not on file    Non-medical: Not on file  Tobacco Use  . Smoking status: Current Every Day Smoker    Packs/day: 0.50    Types: Cigarettes  . Smokeless tobacco: Never Used  Substance and Sexual Activity  . Alcohol use: No  . Drug use: No  . Sexual activity: Not Currently    Birth control/protection: None  Lifestyle  . Physical activity:    Days per week: Not on file    Minutes per session: Not on file  . Stress: Not on file  Relationships  . Social connections:    Talks on phone: Not on file    Gets together: Not on file    Attends religious service: Not on file  Active member of club or organization: Not on file    Attends meetings of clubs or organizations: Not on file    Relationship status: Not on file  . Intimate partner violence:    Fear of current or ex partner: Not on file    Emotionally abused: Not on file    Physically abused: Not on file    Forced sexual activity: Not on file  Other Topics Concern  . Not on file  Social History Narrative  . Not on file    Family History  Problem Relation Age of Onset  . Diabetes Mother   . Hypertension Mother   . Thyroid disease Maternal Grandmother   . Cancer Maternal Grandfather        lung  . Hypertension Maternal Grandfather   . Cancer Other        lung  . Kidney disease Other   . Cancer Paternal Grandmother     BP 129/89   Pulse 89   Ht 5\' 6"  (1.676 m)   Wt 197  lb (89.4 kg)   BMI 31.80 kg/m   Body mass index is 31.8 kg/m.      Objective:   Physical Exam Vitals signs reviewed.  Constitutional:      Appearance: She is well-developed.  HENT:     Head: Normocephalic and atraumatic.  Eyes:     Conjunctiva/sclera: Conjunctivae normal.     Pupils: Pupils are equal, round, and reactive to light.  Neck:     Musculoskeletal: Normal range of motion and neck supple.  Cardiovascular:     Rate and Rhythm: Normal rate and regular rhythm.  Pulmonary:     Effort: Pulmonary effort is normal.  Abdominal:     Palpations: Abdomen is soft.  Musculoskeletal:     Right ankle: Tenderness. Medial malleolus tenderness found.       Feet:  Skin:    General: Skin is warm and dry.  Neurological:     Mental Status: She is alert and oriented to person, place, and time.     Cranial Nerves: No cranial nerve deficit.     Motor: No abnormal muscle tone.     Coordination: Coordination normal.     Deep Tendon Reflexes: Reflexes are normal and symmetric. Reflexes normal.  Psychiatric:        Behavior: Behavior normal.        Thought Content: Thought content normal.        Judgment: Judgment normal.           Assessment & Plan:   Encounter Diagnosis  Name Primary?  . Chronic pain of right ankle Yes   I will give Rx for prednisone dose pack.  I have advised to try Aspercreme or BioFreeze.  She will get the X-ray CD from the hospital.  Return in two weeks.  Call if any problem.  Precautions discussed.   Electronically Signed Darreld Mclean, MD 3/10/20209:44 AM

## 2019-02-15 NOTE — Patient Instructions (Signed)
Out of Work,from last week on

## 2019-03-01 ENCOUNTER — Other Ambulatory Visit: Payer: Self-pay

## 2019-03-01 ENCOUNTER — Encounter: Payer: Self-pay | Admitting: Orthopaedic Surgery

## 2019-03-01 ENCOUNTER — Ambulatory Visit: Payer: No Typology Code available for payment source | Admitting: Orthopaedic Surgery

## 2019-03-01 VITALS — BP 137/89 | HR 78 | Temp 97.8°F | Ht 66.0 in | Wt 193.0 lb

## 2019-03-01 DIAGNOSIS — M25571 Pain in right ankle and joints of right foot: Secondary | ICD-10-CM

## 2019-03-01 DIAGNOSIS — G8929 Other chronic pain: Secondary | ICD-10-CM | POA: Diagnosis not present

## 2019-03-01 NOTE — Progress Notes (Signed)
Patient Vanessa Powell, female DOB:Oct 02, 1976, 43 y.o. HBZ:[id number]  Chief Complaint  Patient presents with  . Ankle Pain    right x 24month no injury     HPI  Vanessa Powell is a 43 y.o. female who has continued pain of the right ankle medially.  She tried the prednisone pack and it did not help. She has a trigger point just proximal to the medial malleolus posteriorly.  She has no redness and no swelling.   Body mass index is 31.15 kg/m.  ROS  Review of Systems  Constitutional: Positive for activity change.  Musculoskeletal: Positive for arthralgias, gait problem and joint swelling.  All other systems reviewed and are negative.   All other systems reviewed and are negative.  The following is a summary of the past history medically, past history surgically, known current medicines, social history and family history.  This information is gathered electronically by the computer from prior information and documentation.  I review this each visit and have found including this information at this point in the chart is beneficial and informative.    Past Medical History:  Diagnosis Date  . Abscess   . Gestational diabetes mellitus, antepartum   . Hypothyroidism   . Thyroid disease     Past Surgical History:  Procedure Laterality Date  . ANKLE SURGERY    . CHOLECYSTECTOMY    . TUBAL LIGATION N/A 02/28/2014   Procedure: POST PARTUM TUBAL LIGATION;  Surgeon: Reva Bores, MD;  Location: [facility];  Service: Gynecology;  Laterality: N/A;    Family History  Problem Relation Age of Onset  . Diabetes Mother   . Hypertension Mother   . Thyroid disease Maternal Grandmother   . Cancer Maternal Grandfather        lung  . Hypertension Maternal Grandfather   . Cancer Other        lung  . Kidney disease Other   . Cancer Paternal Grandmother     Social History Social History   Tobacco Use  . Smoking status: Current Every Day Smoker    Packs/day: 0.50    Types: Cigarettes   . Smokeless tobacco: Never Used  Substance Use Topics  . Alcohol use: No  . Drug use: No    Allergies  Allergen Reactions  . Sulfa Antibiotics Anaphylaxis  . Sulfur Anaphylaxis    Tongue swelling.    Current Outpatient Medications  Medication Sig Dispense Refill  . levothyroxine (SYNTHROID, LEVOTHROID) 50 MCG tablet Take 1 tablet (50 mcg total) by mouth daily before breakfast. 30 tablet 0   No current facility-administered medications for this visit.      Physical Exam  Blood pressure 137/89, pulse 78, temperature 97.8 F (36.6 C), height 5\' 6"  (1.676 m), weight 193 lb (87.5 kg).  Constitutional: overall normal hygiene, normal nutrition, well developed, normal grooming, normal body habitus. Assistive device:none  Musculoskeletal: gait and station Limp right, muscle tone and strength are normal, no tremors or atrophy is present.  .  Neurological: coordination overall normal.  Deep tendon reflex/nerve stretch intact.  Sensation normal.  Cranial nerves II-XII intact.   Skin:   Normal overall no scars, lesions, ulcers or rashes. No psoriasis.  Psychiatric: Alert and oriented x 3.  Recent memory intact, remote memory unclear.  Normal mood and affect. Well groomed.  Good eye contact.  Cardiovascular: overall no swelling, no varicosities, no edema bilaterally, normal temperatures of the legs and arms, no clubbing, cyanosis and good capillary refill.  Lymphatic: palpation is  normal.  There is a trigger point spot of the right ankle just proximal and posterior to the medial malleolus.  There is no redness, no swelling, no wound. She has a limp to the right. ROM of the ankle is full.  All other systems reviewed and are negative   The patient has been educated about the nature of the problem(s) and counseled on treatment options.  The patient appeared to understand what I have discussed and is in agreement with it.  I have reviewed the x-rays done at [facility] on 02-01-2019  and the notes from the ER.  Encounter Diagnosis  Name Primary?  . Chronic pain of right ankle Yes   Procedure note: After permission from the patient and sterile prep of the right ankle, I injected the trigger point with 1% plain Xylocaine and 1 cc of DepoMedrol 40 by sterile technique tolerated well.  Stay out of work rest of week.  PLAN Call if any problems.  Precautions discussed.  Continue current medications.   Return to clinic 1 month   Electronically Signed Darreld Mclean, MD 3/24/20209:05 AM

## 2019-03-01 NOTE — Patient Instructions (Signed)
Out of work rest of week. 

## 2019-03-29 ENCOUNTER — Encounter: Payer: Self-pay | Admitting: Orthopaedic Surgery

## 2019-03-29 ENCOUNTER — Ambulatory Visit: Payer: No Typology Code available for payment source | Admitting: Orthopaedic Surgery
